# Patient Record
Sex: Female | Born: 1965 | Race: White | Hispanic: Yes | Marital: Married | Smoking: Never smoker
Health system: Southern US, Community
[De-identification: ages and names within clinical notes are randomized; demographics above are authoritative.]

## PROBLEM LIST (undated history)

## (undated) DIAGNOSIS — G4700 Insomnia, unspecified: Secondary | ICD-10-CM

## (undated) DIAGNOSIS — F32A Depression, unspecified: Secondary | ICD-10-CM

## (undated) DIAGNOSIS — F329 Major depressive disorder, single episode, unspecified: Secondary | ICD-10-CM

## (undated) DIAGNOSIS — N200 Calculus of kidney: Secondary | ICD-10-CM

## (undated) HISTORY — DX: Calculus of kidney: N20.0

## (undated) HISTORY — PX: LITHOTRIPSY: SUR834

## (undated) HISTORY — DX: Depression, unspecified: F32.A

## (undated) HISTORY — DX: Insomnia, unspecified: G47.00

## (undated) HISTORY — PX: AUGMENTATION MAMMAPLASTY: SUR837

## (undated) HISTORY — DX: Major depressive disorder, single episode, unspecified: F32.9

---

## 2006-04-09 ENCOUNTER — Other Ambulatory Visit: Admission: RE | Admit: 2006-04-09 | Discharge: 2006-04-09 | Payer: Self-pay | Admitting: Family Medicine

## 2007-10-31 ENCOUNTER — Other Ambulatory Visit: Admission: RE | Admit: 2007-10-31 | Discharge: 2007-10-31 | Payer: Self-pay | Admitting: Family Medicine

## 2008-11-11 ENCOUNTER — Ambulatory Visit (HOSPITAL_COMMUNITY): Admission: RE | Admit: 2008-11-11 | Discharge: 2008-11-11 | Payer: Self-pay | Admitting: Urology

## 2009-01-24 ENCOUNTER — Other Ambulatory Visit: Admission: RE | Admit: 2009-01-24 | Discharge: 2009-01-24 | Payer: Self-pay | Admitting: Family Medicine

## 2009-05-20 ENCOUNTER — Ambulatory Visit (HOSPITAL_BASED_OUTPATIENT_CLINIC_OR_DEPARTMENT_OTHER): Admission: RE | Admit: 2009-05-20 | Discharge: 2009-05-20 | Payer: Self-pay | Admitting: Cardiology

## 2009-05-22 ENCOUNTER — Ambulatory Visit: Payer: Self-pay | Admitting: Internal Medicine

## 2010-06-22 ENCOUNTER — Other Ambulatory Visit: Admission: RE | Admit: 2010-06-22 | Discharge: 2010-06-22 | Payer: Self-pay | Admitting: Family Medicine

## 2011-02-12 LAB — BASIC METABOLIC PANEL
GFR calc Af Amer: >60 mL/min (ref >60)
GFR calc non Af Amer: >60 mL/min (ref >60)
Glucose, Bld: 83 mg/dL (ref 70–99)
Potassium: 3.7 mEq/L (ref 3.5–5.1)
Sodium: 134 mEq/L — ABNORMAL LOW (ref 135–145)

## 2011-02-12 LAB — URINALYSIS, ROUTINE W REFLEX MICROSCOPIC
Bilirubin Urine: NEGATIVE
Nitrite: NEGATIVE
Specific Gravity, Urine: 1.022 (ref 1.005–1.030)
pH: 5.5 (ref 5.0–8.0)

## 2011-02-12 LAB — CBC
HCT: 44.2 % (ref 36.0–46.0)
Hemoglobin: 15 g/dL (ref 12.0–15.0)
RBC: 4.87 MIL/uL (ref 3.87–5.11)
WBC: 6.1 10*3/uL (ref 4.0–10.5)

## 2011-02-12 LAB — PREGNANCY, URINE: Preg Test, Ur: NEGATIVE

## 2011-03-13 NOTE — Procedures (Signed)
Cindy Kelly, Cindy Kelly               ACCOUNT NO.:  [id number]   MEDICAL RECORD NO.:  [id number]          PATIENT TYPE:  OUT   LOCATION:  SLEEP CENTER                 FACILITY:  [facility]   PHYSICIAN:  Clinton D. Maple Hudson, MD, FCCP, FACPDATE OF BIRTH:  08/19/1968   DATE OF STUDY:  05/20/2009                            NOCTURNAL POLYSOMNOGRAM   REFERRING PHYSICIAN:  Armanda Magic, M.D.   INDICATION FOR STUDY:  Insomnia with sleep apnea.  Epworth sleepiness  score 1/24, BMI 22.5, weight 123 pounds, height 62 inches, neck 13.5  inches.  Home medication charted and reviewed.   SLEEP ARCHITECTURE:  Total sleep time 288 minutes with sleep efficiency  73.1%.  Stage I was 4.5%, stage II 95.5%, stages III and REM were  absent.  Sleep latency 2 minutes, awake after sleep onset 50.5 minutes,  arousal index 8.8.  Ambien 10 mg was taken at 8:20 p.m.   RESPIRATORY DATA:  Apnea/hypopnea index (AHI) 0 per hour.  No  respiratory sleep disturbance was noted.   OXYGEN DATA:  No snoring noted by technician.  Oxygen desaturation to a  nadir of 95% on room air.  Mean oxygen saturation through the study was  97.2% on room air.   CARDIAC DATA:  Normal sinus rhythm.   MOVEMENT/PARASOMNIA:  Periodic limb movement.  A total of 75 limb jerks  was counted, of which none were associated with arousal or awakening.  No bathroom trips.   IMPRESSION/RECOMMENDATIONS:  1. After Ambien 10 mg, sleep architecture was primarily remarkable for      absence of stages III and REM.  She reported home bedtime 8:30      p.m., awakening 4 a.m. and at home was more comfortable because of      memory foam.  She emphasized recent marital stress as a sleep      problem in discussion with technician.  She was disturbance by      temperature expansion noise from the window, but did not request      ear plugs.  She woke at 2:00 a.m. and was unable to return to      sleep, requesting the study at 3:00 a.m.  2. No respiratory sleep  disturbance.  AHI 0 per hour.  No snoring.      Oxygen desaturation nadir was 95% with mean oxygen saturation      through the study 97.2% on room air.  3. Occasional limb jerks, but none distinctly associated with arousal      or awakening.     Clinton D. Maple Hudson, MD, [facility], FACP  Diplomate, Biomedical engineer of Sleep Medicine  Electronically Signed    CDY/MEDQ  D:  05/21/2009 15:15:49  T:  05/21/2009 23:02:54  Job:  [id number]

## 2011-04-27 ENCOUNTER — Institutional Professional Consult (permissible substitution): Payer: Self-pay | Admitting: Internal Medicine

## 2011-06-29 ENCOUNTER — Telehealth: Payer: Self-pay | Admitting: Internal Medicine

## 2011-06-29 ENCOUNTER — Encounter: Payer: Self-pay | Admitting: Internal Medicine

## 2011-06-29 ENCOUNTER — Ambulatory Visit (INDEPENDENT_AMBULATORY_CARE_PROVIDER_SITE_OTHER): Payer: Managed Care, Other (non HMO) | Admitting: Internal Medicine

## 2011-06-29 VITALS — BP 116/72 | HR 92 | Ht 62.0 in | Wt 121.6 lb

## 2011-06-29 DIAGNOSIS — F5102 Adjustment insomnia: Secondary | ICD-10-CM

## 2011-06-29 DIAGNOSIS — F419 Anxiety disorder, unspecified: Secondary | ICD-10-CM | POA: Insufficient documentation

## 2011-06-29 DIAGNOSIS — F341 Dysthymic disorder: Secondary | ICD-10-CM

## 2011-06-29 DIAGNOSIS — F32A Depression, unspecified: Secondary | ICD-10-CM | POA: Insufficient documentation

## 2011-06-29 DIAGNOSIS — F329 Major depressive disorder, single episode, unspecified: Secondary | ICD-10-CM

## 2011-06-29 DIAGNOSIS — F5109 Other insomnia not due to a substance or known physiological condition: Secondary | ICD-10-CM | POA: Insufficient documentation

## 2011-06-29 MED ORDER — LORAZEPAM 1 MG PO TABS: ORAL_TABLET | ORAL | Status: AC

## 2011-06-29 NOTE — Assessment & Plan Note (Signed)
These are her primary problem and her sleep disorder is secondary as discussed below.

## 2011-06-29 NOTE — Telephone Encounter (Signed)
Per CY---please have the pt call back on Tuesday to see if the office note has been completed. Called and spoke with pt and she stated that she will call back on Tuesday per CY recs.

## 2011-06-29 NOTE — Progress Notes (Signed)
Subjective:    Patient ID: Cindy Kelly, female    DOB: 06/24/1966, 45 y.o.   MRN: [id number]  HPI 06/29/11- 64 year old woman referred courtesy of Dr. Laurann Montana because of insomnia. She complains that she can't fall asleep on her own. Within the past 6 weeks she states she has slept through 8 hours on only 3 nights. In the past she was a very sound sleeper. She underwent a marital separation in September of 2008 and there have been ongoing custody troubles since then. A hearing is scheduled in October on this issue. Beginning in 2009 she has had a great deal of difficulty initiating and maintaining sleep. She has aimed for a bedtime around 8:30 PM but does not fall asleep without medication. She intends to get up around 5 AM. Sleep medications give only temporary help. She describes efforts with Ambien, Ambien CR, Edular, Lunesta. Remeron caused sweating,shortness of breath and palpitation. She was given Wellbutrin XL to take in the morning and Abilify to take at night but has not given those a significant trial. When she began having side effects with Remeron she was told to stop the other meds and wait until she saw me. Most recently she was given lorazepam 1 mg but she has not yet taken it. She describes getting exhausted during the day. If she sits and closes her eyes she remains very aware of her surroundings and insists that she does not sleep. She describes feeling continuously exhausted with difficulty concentrating and remembering. She recently saw a psychiatrist Dr. 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 She has seen a counselor who offered diagnoses of PTSD and anxiety. Cindy Kelly agrees anxiety is an important part of her problem. There is no prior history of thyroid or other medical disorder, or surgery. She is told she is in early menopause. 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 She is originally [place] the [place]. Children are aged 20, 58, and 78 and they live with her. She does not use alcohol, street drugs or tobacco. A nocturnal polysomnogram done at the [facility] 05/20/2009 recorded an Epworth sleepiness score of 1/24, weight of 123 pounds and seems to show a medication effect, being mostly stage II sleep. There was no sleep disordered breathing with an AHI of 0 per hour, no snoring and normal oxygenation. There was no movement disturbance. She woke at 2 AM, unable to return to sleep and the study was ended at 3 AM.  Review of Systems Constitutional:  5 lbweight loss  No- night sweats, fevers, chills,   + fatigue, lassitude. HEENT:   No-  headaches, difficulty swallowing, tooth/dental problems, sore throat,       No-  sneezing, itching, ear ache, nasal congestion, post nasal drip,  CV:  No-   chest pain, orthopnea, PND, swelling in lower extremities, anasarca, dizziness, palpitations Resp: No-   shortness of breath with exertion or at rest.              No-   productive cough,  No non-productive cough,  No-  coughing up of blood.              No-   change in color of mucus.  No- wheezing.   Skin: No-   rash or lesions. GI:  No-   heartburn, indigestion, abdominal pain, nausea, vomiting, diarrhea,  change in bowel habits, loss of appetite GU: No-   dysuria, change in color of urine, no urgency or frequency.  No- flank pain. MS:  No-   joint pain or swelling.  No- decreased range of motion.  No- back pain. Neuro- HPI  Psych: HPI      Objective:   Physical Exam General- Alert, Oriented, Affect-depressed, Distress+  Tearful, blowing nose   Skin- rash-none, lesions- none, excoriation- none Lymphadenopathy- none Head- atraumatic            Eyes- Gross vision intact, PERRLA, conjunctivae clear secretions            Ears- Hearing, canals normal            Nose- Clear, No-Septal dev, mucus,  polyps, erosion, perforation     +watery rhinorhea            Throat- Mallampati II , mucosa clear , drainage- none, tonsils- atrophic Neck- flexible , trachea midline, no stridor , thyroid nl, carotid no bruit Chest - symmetrical excursion , unlabored           Heart/CV- RRR , no murmur , no gallop  , no rub, nl s1 s2                           - JVD- none , edema- none, stasis changes- none, varices- none           Lung- clear to P&A, wheeze- none, cough- none , dullness-none, rub- none           Chest wall-  Abd- tender-no, distended-no, bowel sounds-present, HSM- no Br/ Gen/ Rectal- Not done, not indicated Extrem- cyanosis- none, clubbing, none, atrophy- none, strength- nl Neuro- grossly intact to observation         Assessment & Plan:

## 2011-06-29 NOTE — Patient Instructions (Addendum)
It will be very helpful for you to continue to build a working relationship with your therapist, assisted by Dr Cliffton Asters and Dr Tomasa Rand.  I would like you to look into cognitive behavioral therapy as an adjunct therapy strategy that has been shown to help with insomnia, in addition to our medical therapies. Two web sites are: Cbtforinsomnia.com SleepIO    (letter i  And letter o)   Suggest you take your lorazepam:   1/2 tab in the morning and 1/2 tab in early afternoon   Then 2 mg about 15-20 minutes before bedtime.    Cc Dr Cliffton Asters, Dr Tomasa Rand

## 2011-06-29 NOTE — Assessment & Plan Note (Addendum)
Strong link between her difficulty initiating and maintaining sleep, and her marital/ custodial struggle. She may eventually need in-patient help. Her sleep disorder is a symptom of the problem but has taken on an importance of its own. She probably will do best with around-the-clock help with the anxiety issue, so it doesn't build to a climax as bedtime approaches. 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   I am introducing cognitive behavioral therapy as a resource adjunct. She is building a raport with her therapist, but will need resolution of the underlying stressor- hopefully the hearing in October will make progress.  I would like to see anxiety and depression managed in coordination between Dr Tomasa Rand, the counselor and Dr Cliffton Asters. I am suggesting she spread the lorazepam she already has, so it works more continuously for her through the day. She does not have an organic sleep disorder by her history or by the 2010 sleep test.

## 2011-07-09 ENCOUNTER — Encounter: Payer: Self-pay | Admitting: Cardiology

## 2011-08-14 ENCOUNTER — Ambulatory Visit: Payer: Managed Care, Other (non HMO) | Admitting: Internal Medicine

## 2011-12-03 ENCOUNTER — Other Ambulatory Visit (HOSPITAL_COMMUNITY)
Admission: RE | Admit: 2011-12-03 | Discharge: 2011-12-03 | Disposition: A | Discharge status: Home / Self Care | Payer: Managed Care, Other (non HMO) | Source: Ambulatory Visit | Attending: Family Medicine | Admitting: Family Medicine

## 2011-12-03 ENCOUNTER — Other Ambulatory Visit: Payer: Self-pay | Admitting: Family Medicine

## 2011-12-03 DIAGNOSIS — Z124 Encounter for screening for malignant neoplasm of cervix: Secondary | ICD-10-CM | POA: Insufficient documentation

## 2012-10-27 ENCOUNTER — Other Ambulatory Visit (HOSPITAL_BASED_OUTPATIENT_CLINIC_OR_DEPARTMENT_OTHER): Payer: Self-pay | Admitting: Family Medicine

## 2012-10-27 DIAGNOSIS — Z1231 Encounter for screening mammogram for malignant neoplasm of breast: Secondary | ICD-10-CM

## 2012-10-28 ENCOUNTER — Ambulatory Visit (HOSPITAL_BASED_OUTPATIENT_CLINIC_OR_DEPARTMENT_OTHER)
Admission: RE | Admit: 2012-10-28 | Discharge: 2012-10-28 | Disposition: A | Discharge status: Home / Self Care | Payer: Managed Care, Other (non HMO) | Source: Ambulatory Visit | Attending: Family Medicine | Admitting: Family Medicine

## 2012-10-28 DIAGNOSIS — Z1231 Encounter for screening mammogram for malignant neoplasm of breast: Secondary | ICD-10-CM | POA: Insufficient documentation

## 2012-11-05 ENCOUNTER — Other Ambulatory Visit: Payer: Self-pay | Admitting: Family Medicine

## 2012-11-05 DIAGNOSIS — R928 Other abnormal and inconclusive findings on diagnostic imaging of breast: Secondary | ICD-10-CM

## 2012-11-24 ENCOUNTER — Ambulatory Visit
Admission: RE | Admit: 2012-11-24 | Discharge: 2012-11-24 | Disposition: A | Discharge status: Home / Self Care | Payer: Managed Care, Other (non HMO) | Source: Ambulatory Visit | Attending: Family Medicine | Admitting: Family Medicine

## 2012-11-24 DIAGNOSIS — R928 Other abnormal and inconclusive findings on diagnostic imaging of breast: Secondary | ICD-10-CM

## 2014-04-16 ENCOUNTER — Other Ambulatory Visit (HOSPITAL_BASED_OUTPATIENT_CLINIC_OR_DEPARTMENT_OTHER): Payer: Self-pay | Admitting: *Deleted

## 2014-04-16 DIAGNOSIS — Z1231 Encounter for screening mammogram for malignant neoplasm of breast: Secondary | ICD-10-CM

## 2014-05-05 ENCOUNTER — Ambulatory Visit (HOSPITAL_BASED_OUTPATIENT_CLINIC_OR_DEPARTMENT_OTHER): Payer: Managed Care, Other (non HMO)

## 2014-05-07 ENCOUNTER — Other Ambulatory Visit (HOSPITAL_BASED_OUTPATIENT_CLINIC_OR_DEPARTMENT_OTHER): Payer: Self-pay | Admitting: Physician Assistant

## 2014-05-07 ENCOUNTER — Ambulatory Visit (HOSPITAL_BASED_OUTPATIENT_CLINIC_OR_DEPARTMENT_OTHER)
Admission: RE | Admit: 2014-05-07 | Discharge: 2014-05-07 | Disposition: A | Discharge status: Home / Self Care | Payer: Managed Care, Other (non HMO) | Source: Ambulatory Visit | Attending: Diagnostic Radiology | Admitting: Diagnostic Radiology

## 2014-05-07 DIAGNOSIS — Z1231 Encounter for screening mammogram for malignant neoplasm of breast: Secondary | ICD-10-CM

## 2014-05-18 ENCOUNTER — Encounter: Payer: Self-pay | Admitting: Family

## 2014-05-18 ENCOUNTER — Ambulatory Visit (INDEPENDENT_AMBULATORY_CARE_PROVIDER_SITE_OTHER): Payer: Managed Care, Other (non HMO) | Admitting: Family

## 2014-05-18 VITALS — BP 100/70 | HR 81 | Temp 97.8°F | Resp 14 | Ht 61.0 in | Wt 126.1 lb

## 2014-05-18 DIAGNOSIS — F32A Depression, unspecified: Secondary | ICD-10-CM

## 2014-05-18 DIAGNOSIS — F329 Major depressive disorder, single episode, unspecified: Secondary | ICD-10-CM

## 2014-05-18 DIAGNOSIS — H40059 Ocular hypertension, unspecified eye: Secondary | ICD-10-CM | POA: Insufficient documentation

## 2014-05-18 DIAGNOSIS — F419 Anxiety disorder, unspecified: Principal | ICD-10-CM

## 2014-05-18 DIAGNOSIS — F341 Dysthymic disorder: Secondary | ICD-10-CM

## 2014-05-18 DIAGNOSIS — G4700 Insomnia, unspecified: Secondary | ICD-10-CM

## 2014-05-18 MED ORDER — SUVOREXANT 10 MG PO TABS: 1.0000 | ORAL_TABLET | Freq: Every day | ORAL | Status: DC

## 2014-05-18 MED ORDER — ESCITALOPRAM OXALATE 10 MG PO TABS: ORAL_TABLET | ORAL | Status: DC

## 2014-05-18 NOTE — Patient Instructions (Signed)
Stop temazepam. Start Belsomra for sleep. Start lexapr 10mg .  1/2 tab by mouth once daily for 1 week, then increase to a full tab on week two. Follow up in 1 month.

## 2014-05-18 NOTE — Progress Notes (Signed)
Pre visit review using our clinic review tool, if applicable. No additional management support is needed unless otherwise documented below in the visit note. 

## 2014-05-18 NOTE — Progress Notes (Signed)
   Subjective:    Patient ID: Cindy Kelly, female    DOB: 10/25/1966, 48 y.o.   MRN: [id number]  HPI  [name] is a 48 yr old female who presents today to establish care.  She has two concerns today.   Depression/anxiety and insomnia- works at [employer].  Reports recent increase in job demands.  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  Reports + nausea when she is at work.  She reports that she feels afraid to eat due to fear of vomittng while at work.  She is a divorced single mom.  Has 2 teenaged daughters.   Their father is behind in childcare payments.   She is maintained on Wellbutrin, since 2013.  Reports 1 month ago her Wellbutrin was increased from 150mg  to 300mg . [facility]ie [facility].   Reports that she lays in bed and can't fall asleep despite feeling so tired.  Wakes up feeling "like a zombie."  Has tried and failed [place], restoril,  [place]unesta.     Review of Systems    see HPI  Past Medical History  Diagnosis Date  . Insomnia   . Depression     History   Social History  . Marital Status: Single    Spouse Name: N/A    Number of Children: 3  . Years of Education: N/A   Occupational History  . 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 Smoking status: Never Smoker   . Smokeless tobacco: Never Used  . Alcohol Use: No  . Drug Use: Not on file  . Sexual Activity: Not on file   Other Topics Concern  . Not on file   Social History Narrative  . No narrative on file    Past Surgical History  Procedure Laterality Date  . Lithotripsy  2007-2015    x 3    Family History  Problem Relation Age of Onset  . Kidney disease Father   . Glaucoma Father     Allergies  Allergen Reactions  . Aripiprazole     Headache, nausea, unable to sleep    No current outpatient prescriptions on file prior to visit.   No current  facility-administered medications on file prior to visit.    BP 100/70  Pulse 81  Temp(Src) 97.8 F (36.6 C) (Oral)  Resp 14  Ht 5\' 1"  (1.549 m)  Wt 126 lb 1.3 oz (57.19 kg)  BMI 23.84 kg/m2  SpO2 99%  LMP 05/04/2014    Objective:   Physical Exam  Constitutional: She appears well-developed and well-nourished. No distress.  Psychiatric: Her behavior is normal. Judgment and thought content normal.  Pleasant but tearful.            Assessment & Plan:  35 minutes spent with pt.  >50% of this time was spent counseling pt on her anxiety and depression.

## 2014-05-19 ENCOUNTER — Telehealth: Payer: Self-pay | Admitting: *Deleted

## 2014-05-19 MED ORDER — ZOLPIDEM TARTRATE 5 MG PO TABS: 5.0000 mg | ORAL_TABLET | Freq: Every evening | ORAL | Status: DC | PRN

## 2014-05-19 NOTE — Telephone Encounter (Signed)
Spoke with pt. She states she has taken this in the past and and developed tolerance to it. Has also taken lunesta in the past. Advised pt that we will be limited in cost effective choices since she states she has tried several sleep aids in the past. Pt voices understanding and states she will try Remus Lofflerambien again since she has not had it in some time but would like to know if there are any other alternatives going forward? Rx called to pharmacy voicemail.  Please advise.

## 2014-05-19 NOTE — Telephone Encounter (Signed)
Can try ambien instead. Pended below.

## 2014-05-19 NOTE — Telephone Encounter (Signed)
Received message from pt that Belsomra is not covered by her insurance and cost would be $315. Pt cannot afford this and would like cheaper alternative.  Please advise.

## 2014-05-19 NOTE — Telephone Encounter (Signed)
Left message for pt to return my call.

## 2014-05-20 NOTE — Telephone Encounter (Signed)
Ambien, Lunesta and belsomra are the 3 major sleep aids.  Lets see how she does with a retrial of the ambien. There are some other meds we could try but they generally don't work any better than ambien.

## 2014-05-20 NOTE — Telephone Encounter (Signed)
Pt was contacted and VM left alerting her of Ambien Rx has been called in for her.

## 2014-05-21 DIAGNOSIS — G4700 Insomnia, unspecified: Secondary | ICD-10-CM | POA: Insufficient documentation

## 2014-05-21 NOTE — Assessment & Plan Note (Signed)
Deteriorated. Will continue wellbutrin and add lexapro. Advised pt to consider meeting with a counselor.

## 2014-05-21 NOTE — Assessment & Plan Note (Signed)
Attempted belsomra but insurance will not cover. Rx sent for Hewlett-Packardambien.

## 2014-05-28 ENCOUNTER — Ambulatory Visit: Payer: Managed Care, Other (non HMO) | Admitting: Family

## 2014-06-28 ENCOUNTER — Ambulatory Visit: Payer: Managed Care, Other (non HMO) | Admitting: Family

## 2014-07-07 ENCOUNTER — Ambulatory Visit: Payer: Managed Care, Other (non HMO) | Admitting: Family

## 2014-07-13 ENCOUNTER — Encounter: Payer: Self-pay | Admitting: Family

## 2014-07-13 ENCOUNTER — Ambulatory Visit (INDEPENDENT_AMBULATORY_CARE_PROVIDER_SITE_OTHER): Payer: Managed Care, Other (non HMO) | Admitting: Family

## 2014-07-13 VITALS — BP 110/80 | HR 80 | Temp 97.8°F | Resp 16 | Ht 61.0 in | Wt 130.0 lb

## 2014-07-13 DIAGNOSIS — F419 Anxiety disorder, unspecified: Secondary | ICD-10-CM

## 2014-07-13 DIAGNOSIS — F5109 Other insomnia not due to a substance or known physiological condition: Secondary | ICD-10-CM

## 2014-07-13 DIAGNOSIS — R5383 Other fatigue: Secondary | ICD-10-CM | POA: Insufficient documentation

## 2014-07-13 DIAGNOSIS — R110 Nausea: Secondary | ICD-10-CM

## 2014-07-13 DIAGNOSIS — F341 Dysthymic disorder: Secondary | ICD-10-CM

## 2014-07-13 DIAGNOSIS — F5102 Adjustment insomnia: Secondary | ICD-10-CM

## 2014-07-13 DIAGNOSIS — R5381 Other malaise: Secondary | ICD-10-CM

## 2014-07-13 DIAGNOSIS — F329 Major depressive disorder, single episode, unspecified: Secondary | ICD-10-CM

## 2014-07-13 DIAGNOSIS — R5382 Chronic fatigue, unspecified: Secondary | ICD-10-CM

## 2014-07-13 DIAGNOSIS — F32A Depression, unspecified: Secondary | ICD-10-CM

## 2014-07-13 MED ORDER — ESCITALOPRAM OXALATE 10 MG PO TABS: ORAL_TABLET | ORAL | Status: DC

## 2014-07-13 MED ORDER — ALPRAZOLAM 0.5 MG PO TABS: 0.5000 mg | ORAL_TABLET | Freq: Every evening | ORAL | Status: DC | PRN

## 2014-07-13 NOTE — Assessment & Plan Note (Signed)
Will obtain LFT. No abdominal pain.  Consider abd US/addition of PPI if symptoms worsen.

## 2014-07-13 NOTE — Patient Instructions (Signed)
Stop ambien, start xanax as needed. Continue lexapro and wellbutrin.  Schedule lab appointment at the front desk. Ask front desk re: scheduling appointment with therapist Berniece Andreas. Follow up in 2 months.

## 2014-07-13 NOTE — Progress Notes (Signed)
Subjective:    Patient ID: [name], female    DOB: [birth year], 48 y.o.   MRN: [id number]  HPI  [name] is a 48 yr old female who presets today for follow up.  Last visit she noted worsening anxiety and depression.  She was continued on wellbutrin and lexapro was added to her regimen.  We attempted to initiate belsomra, but this was not covered by her insurance and instead rx was given for [place].    No significant improvement on lexapro.  Feels "cold".  Feels tired all the time.   Denies abd pain,+ intermittent nausea, denies vomitting.   Review of Systems    see HPI  Past Medical History  Diagnosis Date  . Insomnia   . Depression     History   Social History  . Marital Status: Single    Spouse Name: N/A    Number of Children: 3  . 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 Smoking status: Never Smoker   . Smokeless tobacco: Never Used  . Alcohol Use: No  . Drug Use: Not on file  . Sexual Activity: Not on file   Other Topics Concern  . Not on file   Social History Narrative  . No narrative on file    Past Surgical History  Procedure Laterality Date  . Lithotripsy  2007-2015    x 3    Family History  Problem Relation Age of Onset  . Kidney disease Father   . Glaucoma Father     Allergies  Allergen Reactions  . Aripiprazole     Headache, nausea, unable to sleep    Current Outpatient Prescriptions on File Prior to Visit  Medication Sig Dispense Refill  . buPROPion (WELLBUTRIN XL) 150 MG 24 hr tablet Take 300 mg by mouth daily.      Marland Kitchen escitalopram (LEXAPRO) 10 MG tablet 1/2 tab by mouth once daily for 1 week, then increase to a full tab on week two  30 tablet  0  . SIMBRINZA 1-0.2 % SUSP Place 1 drop into both eyes 3 (three) times daily.      . Suvorexant (BELSOMRA) 10 MG TABS Take 1 tablet by mouth daily.  30 tablet  0  . timolol (TIMOPTIC) 0.5 % ophthalmic  solution Place 1 drop into both eyes every morning.      . zolpidem (AMBIEN) 5 MG tablet Take 1 tablet (5 mg total) by mouth at bedtime as needed for sleep.  30 tablet  0   No current facility-administered medications on file prior to visit.    BP 110/80  Pulse 80  Temp(Src) 97.8 F (36.6 C) (Oral)  Resp 16  Ht  (1.549 m)  Wt 130 lb (58.968 kg)  BMI 24.58 kg/m2  SpO2 94%    Objective:   Physical Exam  Constitutional: She is oriented to person, place, and time. She appears well-developed and well-nourished. No distress.  Cardiovascular: Normal rate and regular rhythm.   No murmur heard. Pulmonary/Chest: Effort normal and breath sounds normal. No respiratory distress. She has no wheezes. She has no rales. She exhibits no tenderness.  Abdominal: Soft. Bowel sounds are normal. She exhibits no distension. There is no tenderness.  Neurological: She is alert and oriented to person, place, and time.  Skin: Skin is warm and dry.  Psychiatric: She has a normal mood and affect. Her behavior  is normal. Judgment and thought content normal.          Assessment & Plan:

## 2014-07-13 NOTE — Progress Notes (Signed)
Pre visit review using our clinic review tool, if applicable. No additional management support is needed unless otherwise documented below in the visit note. 

## 2014-07-13 NOTE — Assessment & Plan Note (Signed)
D/c ambien, trial of xanax HS, prn- controlled substance contract is signed.

## 2014-07-13 NOTE — Assessment & Plan Note (Signed)
She seems calmer today.  She is not tearful.  Continue wellbutrin and lexapro.  She is interested in meeting with a therapist. Advised her to schedule visit with Berniece Andreas

## 2014-07-13 NOTE — Assessment & Plan Note (Signed)
Could be secondary to poor sleeping. Will check CBC to rule out anemia and TSH to evaluate for hypothyroid.

## 2014-07-14 ENCOUNTER — Other Ambulatory Visit: Payer: Managed Care, Other (non HMO)

## 2014-07-14 NOTE — Addendum Note (Signed)
Addended by: Silvio Pate D on: 07/14/2014 01:22 PM   Modules accepted: Orders

## 2014-07-15 LAB — HEPATIC FUNCTION PANEL
ALBUMIN: 4 g/dL (ref 3.5–5.2)
ALT: 18 U/L (ref 0–35)
AST: 22 U/L (ref 0–37)
Alkaline Phosphatase: 71 U/L (ref 39–117)
Bilirubin, Direct: 0 mg/dL (ref 0.0–0.3)
Total Bilirubin: 0.3 mg/dL (ref 0.2–1.2)
Total Protein: 7.9 g/dL (ref 6.0–8.3)

## 2014-07-15 LAB — CBC WITH DIFFERENTIAL/PLATELET
BASOS PCT: 0.2 % (ref 0.0–3.0)
Basophils Absolute: 0 10*3/uL (ref 0.0–0.1)
EOS PCT: 1.7 % (ref 0.0–5.0)
Eosinophils Absolute: 0.1 10*3/uL (ref 0.0–0.7)
HEMATOCRIT: 44.3 % (ref 36.0–46.0)
HEMOGLOBIN: 14.6 g/dL (ref 12.0–15.0)
LYMPHS ABS: 1.9 10*3/uL (ref 0.7–4.0)
Lymphocytes Relative: 22.8 % (ref 12.0–46.0)
MCHC: 33 g/dL (ref 30.0–36.0)
MCV: 91.5 fl (ref 78.0–100.0)
MONOS PCT: 3.6 % (ref 3.0–12.0)
Monocytes Absolute: 0.3 10*3/uL (ref 0.1–1.0)
NEUTROS ABS: 5.8 10*3/uL (ref 1.4–7.7)
Neutrophils Relative %: 71.7 % (ref 43.0–77.0)
PLATELETS: 279 10*3/uL (ref 150.0–400.0)
RBC: 4.84 Mil/uL (ref 3.87–5.11)
RDW: 13.7 % (ref 11.5–15.5)
WBC: 8.1 10*3/uL (ref 4.0–10.5)

## 2014-07-15 LAB — TSH: TSH: 0.77 u[IU]/mL (ref 0.35–4.50)

## 2014-07-16 ENCOUNTER — Encounter: Payer: Self-pay | Admitting: Family

## 2014-07-19 ENCOUNTER — Telehealth: Payer: Self-pay | Admitting: Family

## 2014-07-19 NOTE — Telephone Encounter (Addendum)
Opened in error

## 2014-07-23 ENCOUNTER — Telehealth: Payer: Self-pay | Admitting: Family

## 2014-07-23 NOTE — Telephone Encounter (Signed)
Please call patient with lab results.  She cannot answer her phone at work so leave her a detailed message

## 2014-07-23 NOTE — Telephone Encounter (Signed)
Left detailed message on cell# per 07/16/14 lab letter and to call if any questions.

## 2014-09-15 ENCOUNTER — Ambulatory Visit (INDEPENDENT_AMBULATORY_CARE_PROVIDER_SITE_OTHER): Payer: Managed Care, Other (non HMO) | Admitting: Family

## 2014-09-15 ENCOUNTER — Encounter: Payer: Self-pay | Admitting: Family

## 2014-09-15 ENCOUNTER — Telehealth: Payer: Self-pay | Admitting: *Deleted

## 2014-09-15 VITALS — BP 100/78 | HR 68 | Temp 97.7°F | Resp 16 | Ht 61.0 in | Wt 131.6 lb

## 2014-09-15 DIAGNOSIS — F418 Other specified anxiety disorders: Secondary | ICD-10-CM

## 2014-09-15 DIAGNOSIS — F5109 Other insomnia not due to a substance or known physiological condition: Secondary | ICD-10-CM

## 2014-09-15 DIAGNOSIS — F329 Major depressive disorder, single episode, unspecified: Secondary | ICD-10-CM

## 2014-09-15 DIAGNOSIS — Z0000 Encounter for general adult medical examination without abnormal findings: Secondary | ICD-10-CM

## 2014-09-15 DIAGNOSIS — F32A Depression, unspecified: Secondary | ICD-10-CM

## 2014-09-15 DIAGNOSIS — F419 Anxiety disorder, unspecified: Principal | ICD-10-CM

## 2014-09-15 MED ORDER — ALPRAZOLAM 0.5 MG PO TABS: ORAL_TABLET | ORAL | Status: DC

## 2014-09-15 MED ORDER — BUPROPION HCL ER (XL) 300 MG PO TB24: 300.0000 mg | ORAL_TABLET | Freq: Every day | ORAL | Status: DC

## 2014-09-15 MED ORDER — ESCITALOPRAM OXALATE 20 MG PO TABS: 20.0000 mg | ORAL_TABLET | Freq: Every day | ORAL | Status: DC

## 2014-09-15 NOTE — Assessment & Plan Note (Addendum)
Unchanged.  Worsened by ongoing situation stress with her job. Will increase lexapro from 10mg  to 20mg . Continue wellbutrin xl 300.  I gave her a number for a local therapist who has evening and weekend hours.   I have also given her numbers of some local psychiatrists to see.

## 2014-09-15 NOTE — Telephone Encounter (Signed)
FMLA forms completed and faxed to [employer]. At [phone]. Wellness Form cannot be completed until pt returns to the lab for fasting lipid panel. Order has been placed. Pt needs to schedule lab appt. Left message for pt to return my call.

## 2014-09-15 NOTE — Patient Instructions (Addendum)
Continue wellbutrin 300mg  tab once daily. Increase lexapro from 10mg  to 20mg  once daily. Contact therapist with evening hours to arrange a convenient appointment time. Contact psychiatry for appointment, here are some numbers to try:  Dr. Evelene CroonKaur- [phone] Dr. Alvira MondayMcKinney-([phone]) [phone]

## 2014-09-15 NOTE — Progress Notes (Signed)
Pre visit review using our clinic review tool, if applicable. No additional management support is needed unless otherwise documented below in the visit note. 

## 2014-09-15 NOTE — Assessment & Plan Note (Signed)
Increase xanax from 0.5mg  1 tab PO HS to 1-2 tabs PO HS PRN.

## 2014-09-15 NOTE — Telephone Encounter (Signed)
Pt scheduled lab appointment for 10/20/14. Pt would like to know can she scheduled a tetanus shot that day?

## 2014-09-15 NOTE — Progress Notes (Signed)
Subjective:    Patient ID: Cindy Kelly, female    DOB: [birth year], 48 y.o.   MRN: [id number]  HPI  [name] is a 48 yo female here for follow up of insomnia and anxiety/depression.   1) Insomnia- Sleeps better on weekends. Gets anxious about whether or not she will sleep during the week. She complains of trouble initiating and having interrupted sleep. Wakes up exhausted. Denies daytime somnolence.  Xanax trial did not help. Feels lack of sleep makes the anxiety and depression worse.   2) Has not met with Marya Amsler for therapy. Takes 2 of Wellbutrin XL for 300 mg total + one Lexapro in the morning. Denies panic attacks, feels depression is not getting better. Feels at time it gets worse. She feels sad about stress at work, [facility]. Family lives up [place].   No thoughts of harming self or others today.  Review of Systems See HPI Past Medical History  Diagnosis Date  . Insomnia   . Depression     History   Social History  . Marital Status: Single    Spouse Name: N/A    Number of Children: 3  . 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 Smoking status: Never Smoker   . Smokeless tobacco: Never Used  . Alcohol Use: No  . Drug Use: Not on file  . Sexual Activity: Not on file   Other Topics Concern  . Not on file   Social History Narrative    Past Surgical History  Procedure Laterality Date  . Lithotripsy  2007-2015    x 3    Family History  Problem Relation Age of Onset  . Kidney disease Father   . Glaucoma Father     Allergies  Allergen Reactions  . Aripiprazole     Headache, nausea, unable to sleep    Current Outpatient Prescriptions on File Prior to Visit  Medication Sig Dispense Refill  . ALPRAZolam (XANAX) 0.5 MG tablet Take 1 tablet (0.5 mg total) by mouth at bedtime as needed for anxiety. 30 tablet 0  . buPROPion (WELLBUTRIN XL) 150 MG 24 hr tablet  Take 300 mg by mouth daily.    Marland Kitchen escitalopram (LEXAPRO) 10 MG tablet One tab by mouth once daily 30 tablet 5  . SIMBRINZA 1-0.2 % SUSP Place 1 drop into both eyes 3 (three) times daily.    . timolol (TIMOPTIC) 0.5 % ophthalmic solution Place 1 drop into both eyes every morning.     No current facility-administered medications on file prior to visit.    BP 100/78 mmHg  Pulse 68  Temp(Src) 97.7 F (36.5 C) (Oral)  Resp 16  Ht 5' 1" (1.549 m)  Wt 131 lb 9.6 oz (59.693 kg)  BMI 24.88 kg/m2  SpO2 96%  LMP 08/19/2014       Objective:   Physical Exam  Constitutional: She is oriented to person, place, and time. She appears well-developed and well-nourished.  HENT:  Head: Normocephalic and atraumatic.  Eyes: No scleral icterus.  Musculoskeletal: She exhibits no edema.  Neurological: She is alert and oriented to person, place, and time.  Psychiatric: Her affect is not angry, not blunt, not labile and not inappropriate. Her speech is not rapid and/or pressured, not delayed, not tangential and not slurred. She is slowed. She is not agitated, not aggressive, not hyperactive, not withdrawn, not actively hallucinating and not  combative. Thought content is not paranoid and not delusional. Cognition and memory are not impaired. She does not express impulsivity or inappropriate judgment. She exhibits a depressed mood. She expresses no homicidal and no suicidal ideation. She expresses no suicidal plans and no homicidal plans. She is communicative. She exhibits normal recent memory and normal remote memory.  Tearful today.  She is attentive.         Assessment & Plan:  I have personally seen and examined [name] along with Lorane Gell NP for training/orientation purposes. I agree with [name]' assessment and plan.   She will return for tetanus another day when she is not in a rush.

## 2014-09-17 NOTE — Telephone Encounter (Signed)
Yes, you can schedule her on the nurse schedule if there is a time available.

## 2014-09-21 ENCOUNTER — Other Ambulatory Visit: Payer: Managed Care, Other (non HMO)

## 2014-10-20 ENCOUNTER — Other Ambulatory Visit: Payer: Managed Care, Other (non HMO)

## 2014-11-03 ENCOUNTER — Encounter: Payer: Self-pay | Admitting: *Deleted

## 2015-01-19 ENCOUNTER — Ambulatory Visit (INDEPENDENT_AMBULATORY_CARE_PROVIDER_SITE_OTHER): Payer: BLUE CROSS/BLUE SHIELD | Admitting: Physician Assistant

## 2015-01-19 ENCOUNTER — Other Ambulatory Visit (HOSPITAL_COMMUNITY)
Admission: RE | Admit: 2015-01-19 | Discharge: 2015-01-19 | Disposition: A | Discharge status: Home / Self Care | Payer: BLUE CROSS/BLUE SHIELD | Source: Ambulatory Visit | Attending: Physician Assistant | Admitting: Physician Assistant

## 2015-01-19 ENCOUNTER — Encounter: Payer: Self-pay | Admitting: Physician Assistant

## 2015-01-19 VITALS — BP 108/85 | HR 77 | Temp 98.3°F | Resp 16 | Ht 61.0 in | Wt 128.4 lb

## 2015-01-19 DIAGNOSIS — N760 Acute vaginitis: Secondary | ICD-10-CM | POA: Insufficient documentation

## 2015-01-19 DIAGNOSIS — L710 Perioral dermatitis: Secondary | ICD-10-CM

## 2015-01-19 MED ORDER — FLUCONAZOLE 150 MG PO TABS: 150.0000 mg | ORAL_TABLET | Freq: Every day | ORAL | Status: DC

## 2015-01-19 MED ORDER — MUPIROCIN CALCIUM 2 % EX CREA: 1.0000 "application " | TOPICAL_CREAM | Freq: Two times a day (BID) | CUTANEOUS | Status: DC

## 2015-01-19 NOTE — Patient Instructions (Signed)
For the bumps above your lip, please apply some witch hazel to help dry these lesions up.  Apply the Mupirocin ointment to the area as directed.  For the vaginal itch, please take the Diflucan as directed.  Avoid using perfumed or dyed lotions/soaps on that region. Cetaphil or Dove soaps are a good option. I will call you with your results and we will alter your treatment regimen based on those results.

## 2015-01-19 NOTE — Assessment & Plan Note (Signed)
Unclear.  Some lesions seeming consistent with yeast.  Mild discharge.  Wet prep sent.  Will empirically treat with Diflucan. Supportive measures discussed.

## 2015-01-19 NOTE — Progress Notes (Signed)
Pre visit review using our clinic review tool, if applicable. No additional management support is needed unless otherwise documented below in the visit note/SLS  

## 2015-01-19 NOTE — Assessment & Plan Note (Signed)
Rx mupirocin ointment.  Seabreeze for itch relief and to dry up lesions.

## 2015-01-19 NOTE — Progress Notes (Signed)
   Patient presents to clinic today c/o vaginal itching x 4 days.  Associated symptoms include -- none.  Patient endorses she is not currently sexually active.  Denies rash, discharge, urinary urgency, frequency.  Denies change in soaps or lotions.  Denies history of vaginal yeast infections. Has taken Monistat without relief in symptoms.  Past Medical History  Diagnosis Date  . Insomnia   . Depression     Current Outpatient Prescriptions on File Prior to Visit  Medication Sig Dispense Refill  . ALPRAZolam (XANAX) 0.5 MG tablet 1-2 tabs by mouth at bedtime as needed for sleep 45 tablet 0  . buPROPion (WELLBUTRIN XL) 300 MG 24 hr tablet Take 1 tablet (300 mg total) by mouth daily. 30 tablet 3  . diclofenac (VOLTAREN) 75 MG EC tablet Take 75 mg by mouth daily.  1  . escitalopram (LEXAPRO) 20 MG tablet Take 1 tablet (20 mg total) by mouth daily. 30 tablet 3  . estradiol (ESTRACE) 1 MG tablet Take 1 mg by mouth daily.  6  . SIMBRINZA 1-0.2 % SUSP Place 1 drop into both eyes 3 (three) times daily.    . timolol (TIMOPTIC) 0.5 % ophthalmic solution Place 1 drop into both eyes every morning.     No current facility-administered medications on file prior to visit.    Allergies  Allergen Reactions  . Aripiprazole     Headache, nausea, unable to sleep    Family History  Problem Relation Age of Onset  . Kidney disease Father   . Glaucoma Father     History   Social History  . Marital Status: Single    Spouse Name: N/A  . Number of Children: 3  . Years of Education: N/A   Occupational History  . 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 Smoking status: Never Smoker   . Smokeless tobacco: Never Used  . Alcohol Use: No  . Drug Use: Not on file  . Sexual Activity: Not on file   Other Topics Concern  . None   Social History Narrative   Review of Systems - See HPI.  All other ROS are negative.  BP 108/85 mmHg  Pulse 77  Temp(Src) 98.3 F  (36.8 C) (Oral)  Resp 16  Ht 5\' 1"  (1.549 m)  Wt 128 lb 6 oz (58.231 kg)  BMI 24.27 kg/m2  SpO2 100%  LMP 12/21/2014  Physical Exam  Constitutional: She is well-developed, well-nourished, and in no distress.  HENT:  Head: Normocephalic and atraumatic.  Cardiovascular: Normal rate, regular rhythm, normal heart sounds and intact distal pulses.   Pulmonary/Chest: Effort normal and breath sounds normal.  Genitourinary: Uterus normal, cervix normal, right adnexa normal and left adnexa normal. Vulva exhibits erythema and lesion. Vaginal discharge found.  Neurological: She is alert.  Skin: Skin is warm and dry. No rash noted.  Psychiatric: Affect normal.  Vitals reviewed.  Assessment/Plan: Perioral dermatitis Rx mupirocin ointment.  Seabreeze for itch relief and to dry up lesions.   Vaginitis and vulvovaginitis Unclear.  Some lesions seeming consistent with yeast.  Mild discharge.  Wet prep sent.  Will empirically treat with Diflucan. Supportive measures discussed.

## 2015-01-21 LAB — CERVICOVAGINAL ANCILLARY ONLY
Wet Prep (BD Affirm): NEGATIVE
Wet Prep (BD Affirm): NEGATIVE
Wet Prep (BD Affirm): NEGATIVE

## 2015-01-27 ENCOUNTER — Telehealth: Payer: Self-pay | Admitting: Family

## 2015-01-27 NOTE — Telephone Encounter (Signed)
Caller name: Margy Clarksicolas, Karagan G Relation to pt: self Call back number: [phone]   Reason for call:  Pt was able to view lab results on mychart  In need of clarification. Please advise

## 2015-01-28 NOTE — Telephone Encounter (Signed)
Left message for pt to return my call.

## 2015-02-01 NOTE — Telephone Encounter (Signed)
Left message for pt to return my call. Also, see lab 01/27/15 lab note.

## 2015-05-05 ENCOUNTER — Other Ambulatory Visit: Payer: Self-pay | Admitting: Family

## 2015-05-05 DIAGNOSIS — Z1231 Encounter for screening mammogram for malignant neoplasm of breast: Secondary | ICD-10-CM

## 2015-05-10 ENCOUNTER — Ambulatory Visit (HOSPITAL_BASED_OUTPATIENT_CLINIC_OR_DEPARTMENT_OTHER)
Admission: RE | Admit: 2015-05-10 | Discharge: 2015-05-10 | Disposition: A | Discharge status: Home / Self Care | Payer: BLUE CROSS/BLUE SHIELD | Source: Ambulatory Visit | Attending: Family | Admitting: Family

## 2015-05-10 DIAGNOSIS — Z1231 Encounter for screening mammogram for malignant neoplasm of breast: Secondary | ICD-10-CM

## 2015-12-09 ENCOUNTER — Encounter: Payer: Self-pay | Admitting: Family

## 2015-12-09 ENCOUNTER — Ambulatory Visit (INDEPENDENT_AMBULATORY_CARE_PROVIDER_SITE_OTHER): Payer: BLUE CROSS/BLUE SHIELD | Admitting: Family

## 2015-12-09 ENCOUNTER — Telehealth: Payer: Self-pay | Admitting: Family

## 2015-12-09 VITALS — BP 114/75 | HR 85 | Temp 97.8°F | Resp 16 | Ht 61.0 in | Wt 136.4 lb

## 2015-12-09 DIAGNOSIS — R635 Abnormal weight gain: Secondary | ICD-10-CM | POA: Diagnosis not present

## 2015-12-09 DIAGNOSIS — L700 Acne vulgaris: Secondary | ICD-10-CM

## 2015-12-09 DIAGNOSIS — F32A Depression, unspecified: Secondary | ICD-10-CM

## 2015-12-09 DIAGNOSIS — F419 Anxiety disorder, unspecified: Secondary | ICD-10-CM

## 2015-12-09 DIAGNOSIS — F418 Other specified anxiety disorders: Secondary | ICD-10-CM | POA: Diagnosis not present

## 2015-12-09 DIAGNOSIS — L709 Acne, unspecified: Secondary | ICD-10-CM | POA: Insufficient documentation

## 2015-12-09 DIAGNOSIS — F329 Major depressive disorder, single episode, unspecified: Secondary | ICD-10-CM

## 2015-12-09 DIAGNOSIS — E039 Hypothyroidism, unspecified: Secondary | ICD-10-CM | POA: Diagnosis not present

## 2015-12-09 DIAGNOSIS — G4700 Insomnia, unspecified: Secondary | ICD-10-CM

## 2015-12-09 LAB — TSH: TSH: 1.1 u[IU]/mL (ref 0.35–4.50)

## 2015-12-09 MED ORDER — ADAPALENE-BENZOYL PEROXIDE 0.1-2.5 % EX GEL: 1.0000 "application " | Freq: Every day | CUTANEOUS | Status: DC

## 2015-12-09 MED ORDER — TRAZODONE HCL 50 MG PO TABS: 25.0000 mg | ORAL_TABLET | Freq: Every evening | ORAL | Status: DC | PRN

## 2015-12-09 MED ORDER — BUPROPION HCL ER (XL) 300 MG PO TB24: 300.0000 mg | ORAL_TABLET | Freq: Every day | ORAL | Status: DC

## 2015-12-09 NOTE — Assessment & Plan Note (Signed)
Fair control- see (mychart). Continue wellbutrin, consider adding effexor versus resuming lexapro.  Not clear why she stopped lexapro previously.

## 2015-12-09 NOTE — Progress Notes (Signed)
Subjective:    Patient ID: Cindy Kelly, female    DOB: [birth year], 50 y.o.   MRN: [id number]  HPI  [name] is a 50 yr old female who presents today for follow up.  1) Weight gain- Patient is very concerned about recent weight gain. Describes healthy diet, regular exercise.  Reports she sometimes counts calories and stays around 1300/day.  Wt Readings from Last 3 Encounters:  12/09/15 136 lb 6.4 oz (61.871 kg)  01/19/15 128 lb 6 oz (58.231 kg)  09/15/14 131 lb 9.6 oz (59.693 kg)   2) Depression- reports some dificulty sleeping since her divorce in 2008.  Ambien used to work but then it stopped.  Mood is down some, but has a new job which is less stressful that she is glad about.  Feels down "because of my weight gain."    3) Acne-  Reports recent outbreak of acne.  She has used daughter's epiduo.  This helped. She is requesting rx.   Lab Results  Component Value Date   TSH 0.77 07/14/2014    Review of Systems Past Medical History  Diagnosis Date  . Insomnia   . Depression     Social History   Social History  . Marital Status: Single    Spouse Name: N/A  . Number of Children: 3  . 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 Smoking status: Never Smoker   . Smokeless tobacco: Never Used  . Alcohol Use: No  . Drug Use: Not on file  . Sexual Activity: Not on file   Other Topics Concern  . Not on file   Social History Narrative    Past Surgical History  Procedure Laterality Date  . Lithotripsy  2007-2015    x 3    Family History  Problem Relation Age of Onset  . Kidney disease Father   . Glaucoma Father     Allergies  Allergen Reactions  . Aripiprazole     Headache, nausea, unable to sleep    Current Outpatient Prescriptions on File Prior to Visit  Medication Sig Dispense Refill  . ALPRAZolam (XANAX) 0.5 MG tablet 1-2 tabs by mouth at bedtime as needed for sleep  45 tablet 0  . buPROPion (WELLBUTRIN XL) 300 MG 24 hr tablet Take 1 tablet (300 mg total) by mouth daily. 30 tablet 3  . Multiple Vitamins-Minerals (MULTIVITAMIN GUMMIES ADULT) CHEW Chew 2 each by mouth daily.    Marland Kitchen SIMBRINZA 1-0.2 % SUSP Place 1 drop into both eyes 3 (three) times daily.     No current facility-administered medications on file prior to visit.    BP 114/75 mmHg  Pulse 85  Temp(Src) 97.8 F (36.6 C) (Oral)  Resp 16  Ht  (1.549 m)  Wt 136 lb 6.4 oz (61.871 kg)  BMI 25.79 kg/m2  SpO2 100%  LMP 02/06/2015       Objective:   Physical Exam  Constitutional: She is oriented to person, place, and time. She appears well-developed and well-nourished.  HENT:  Head: Normocephalic and atraumatic.  Musculoskeletal: She exhibits no edema.  Neurological: She is alert and oriented to person, place, and time.  Skin:  + facial acne noted.  Psychiatric: She has a normal mood and affect. Her behavior is normal. Judgment and thought content normal.          Assessment & Plan:  Weight gain- advised  pt if no weight loss on 1300 calories, to drop down to 1100 calories a day. Continue regular cardio.   25 min spent with pt. >50% of this time was spent counseling pt on anxiety/depression/weight loss.

## 2015-12-09 NOTE — Assessment & Plan Note (Signed)
rx sent for epiduo.

## 2015-12-09 NOTE — Assessment & Plan Note (Signed)
Uncontrolled.  Seems to be worse due to concern about weight gain. We did discuss that wellbutrin can have SE of insomnia.  However, I would like to avoid med changes at this time.

## 2015-12-09 NOTE — Telephone Encounter (Signed)
See mychart.  

## 2015-12-09 NOTE — Progress Notes (Signed)
Pre visit review using our clinic review tool, if applicable. No additional management support is needed unless otherwise documented below in the visit note. 

## 2015-12-09 NOTE — Patient Instructions (Addendum)
Start trazodone once daily at bedtime for sleep. Cut your calories from 1300 to 1000/day.  Continue your regular exercise. Start epiduo for your acne.

## 2015-12-13 ENCOUNTER — Telehealth: Payer: Self-pay | Admitting: *Deleted

## 2015-12-13 NOTE — Telephone Encounter (Signed)
Received notice that insurance will require prior auth for Epiduo gel.  Wants to know if pt has tried / failed or had side effects to generic adapalene or any other products for acne. Notified pt. She has never had generic brand and is unsure of other medications she may have tried and failed. She states she was given an ointment last year for her acne. Per review of med list from 12/2014, pt was given mupirocin oint and advised to [place] witch hazel. She states she will email [place] a list of other medications she may have tried in the past. Awaiting response from pt.

## 2015-12-14 ENCOUNTER — Encounter: Payer: Self-pay | Admitting: Family

## 2015-12-14 NOTE — Telephone Encounter (Signed)
Pt responded via mychart and PA was submitted. See 12/14/15 pt email.

## 2015-12-16 NOTE — Telephone Encounter (Signed)
Insurance has denied Epiduo and will fax letter. Will also check with pharmacy to see if they submitted claim for generic as that was one of the questions on the PA form once pharmacy opens. Spoke with pharmacist and she states that there was not an available generic in her system to run claim under.  Please advise?

## 2015-12-19 MED ORDER — ADAPALENE 0.1 % EX GEL: Freq: Every day | CUTANEOUS | Status: DC

## 2015-12-19 NOTE — Telephone Encounter (Signed)
Received letter from insurance and it states that Eipduo is approved when generic adapalene has been tried and failed or not tolerated.  Please advise?

## 2015-12-19 NOTE — Telephone Encounter (Signed)
Received notice via covermymeds that adapalene requires prior auth. PA completed and received approval via covermymeds. Sent message to pt and notified pharmacy. Pharmacist states system still shows adapalene is requiring authorization. I requested that they call pt's insurance for further clarification since covermymeds is saying it is approved. They will call insurance.

## 2015-12-19 NOTE — Telephone Encounter (Signed)
rx sent for generic adapalene.

## 2015-12-23 ENCOUNTER — Telehealth: Payer: Self-pay | Admitting: Family

## 2015-12-23 NOTE — Telephone Encounter (Signed)
Please contact pt and ask her how long she has been off of lexapro and why she discontinued.  Is she interested in restarting lexapro for mood?  If so I can send rx and have her follow up in 1 month.

## 2015-12-26 NOTE — Telephone Encounter (Signed)
Left message for pt to return my call.

## 2015-12-27 NOTE — Telephone Encounter (Signed)
Left detailed message on cell.

## 2016-01-27 NOTE — Telephone Encounter (Signed)
Called left message on cell  to call back

## 2016-04-16 ENCOUNTER — Encounter: Payer: Self-pay | Admitting: Family

## 2016-04-24 DIAGNOSIS — H401131 Primary open-angle glaucoma, bilateral, mild stage: Secondary | ICD-10-CM | POA: Diagnosis not present

## 2016-04-24 DIAGNOSIS — H25813 Combined forms of age-related cataract, bilateral: Secondary | ICD-10-CM | POA: Diagnosis not present

## 2016-07-12 DIAGNOSIS — J069 Acute upper respiratory infection, unspecified: Secondary | ICD-10-CM | POA: Diagnosis not present

## 2016-07-12 DIAGNOSIS — B9789 Other viral agents as the cause of diseases classified elsewhere: Secondary | ICD-10-CM | POA: Diagnosis not present

## 2016-07-16 DIAGNOSIS — J0110 Acute frontal sinusitis, unspecified: Secondary | ICD-10-CM | POA: Diagnosis not present

## 2016-08-08 ENCOUNTER — Other Ambulatory Visit: Payer: Self-pay | Admitting: Family

## 2016-08-08 DIAGNOSIS — Z1231 Encounter for screening mammogram for malignant neoplasm of breast: Secondary | ICD-10-CM

## 2016-08-09 ENCOUNTER — Telehealth: Payer: Self-pay | Admitting: Family

## 2016-08-09 NOTE — Telephone Encounter (Signed)
Sure, that is fine.

## 2016-08-09 NOTE — Telephone Encounter (Signed)
°  Relation to WU:JWJXpt:self Call back [phone] Pharmacy:  Reason for call: pt would like to establish with a primary doctor and wants to est with dr. Patsy Lageropland, states her daughter is getting established with her and she want them to have the same doctor. Ok to switch?

## 2016-08-10 NOTE — Telephone Encounter (Signed)
Ok with me 

## 2016-08-14 ENCOUNTER — Ambulatory Visit (HOSPITAL_BASED_OUTPATIENT_CLINIC_OR_DEPARTMENT_OTHER): Payer: BLUE CROSS/BLUE SHIELD

## 2016-08-14 ENCOUNTER — Other Ambulatory Visit: Payer: Self-pay | Admitting: Family

## 2016-08-14 ENCOUNTER — Ambulatory Visit (HOSPITAL_BASED_OUTPATIENT_CLINIC_OR_DEPARTMENT_OTHER)
Admission: RE | Admit: 2016-08-14 | Discharge: 2016-08-14 | Disposition: A | Discharge status: Home / Self Care | Payer: BLUE CROSS/BLUE SHIELD | Source: Ambulatory Visit | Attending: Family | Admitting: Family

## 2016-08-14 DIAGNOSIS — Z1231 Encounter for screening mammogram for malignant neoplasm of breast: Secondary | ICD-10-CM

## 2016-08-14 NOTE — Telephone Encounter (Signed)
lvm for pt to call and make appt to transfer to Dr. Patsy Lageropland

## 2016-08-14 NOTE — Telephone Encounter (Signed)
Pt called back in. She says that she dont feel that a ov should be needed to establish care with provider. Her concern is that she will have to pay a 30.00 co-pay. Please advise for scheduling.

## 2016-08-16 NOTE — Telephone Encounter (Signed)
Tried to contact pt. Left voicemail informing pt that her copay will be based on her insurance. If she doesn't need to be seen right now she can always wait to establish and come in at a later time. Did inform pt if she has any refills she will need to be seen first since she has not established care with Dr. Patsy Lageropland yet.

## 2016-08-29 ENCOUNTER — Telehealth: Payer: Self-pay | Admitting: Behavioral Health

## 2016-08-29 ENCOUNTER — Ambulatory Visit (INDEPENDENT_AMBULATORY_CARE_PROVIDER_SITE_OTHER): Payer: BLUE CROSS/BLUE SHIELD | Admitting: Family Medicine

## 2016-08-29 ENCOUNTER — Encounter: Payer: Self-pay | Admitting: Family Medicine

## 2016-08-29 VITALS — BP 116/80 | HR 85 | Temp 98.2°F | Ht 61.0 in | Wt 124.6 lb

## 2016-08-29 DIAGNOSIS — R8290 Unspecified abnormal findings in urine: Secondary | ICD-10-CM | POA: Diagnosis not present

## 2016-08-29 LAB — POC URINALSYSI DIPSTICK (AUTOMATED)
Bilirubin, UA: NEGATIVE
Glucose, UA: NEGATIVE
KETONES UA: NEGATIVE
Nitrite, UA: NEGATIVE
PROTEIN UA: NEGATIVE
RBC UA: NEGATIVE
Spec Grav, UA: 1.015
UROBILINOGEN UA: NEGATIVE
pH, UA: 7

## 2016-08-29 MED ORDER — NITROFURANTOIN MONOHYD MACRO 100 MG PO CAPS: 100.0000 mg | ORAL_CAPSULE | Freq: Two times a day (BID) | ORAL | 0 refills | Status: DC

## 2016-08-29 NOTE — Progress Notes (Signed)
Pre visit review using our clinic review tool, if applicable. No additional management support is needed unless otherwise documented below in the visit note. 

## 2016-08-29 NOTE — Patient Instructions (Addendum)
I will be in touch with your urine culture- for the time being we will have you start on macrobid twice a day for 7 days Let me know if you develop any fever, pain, or other concerning symptoms

## 2016-08-29 NOTE — Progress Notes (Signed)
[facility] [address], [address] [place], [place] [phone] [phone]  Date:  08/29/2016   Name:  Cindy Kelly   DOB:  03/21/1966   MRN:  [id number]  PCP:  Lemont Fillers'SULLIVAN,MELISSA S., NP    Chief Complaint: Urinary odor (c/o urinary odor x 3-4 weeks. pt denies any increased urinary frequency, back pain, buring or discomfort. declined flu vaccine. )   History of Present Illness:  Cindy Kelly is a 50 y.o. very pleasant female patient who presents with the following:  She has noted a urinary odor for nearly one month- no hematuria, no dysuria. No vaginal sx or discharge. No belly pain, back pain or fever. The urinary odor has seemed to resolve the last couple of days.   She has an IUD in place She is otherwise feeling well and is generally in good health  Patient Active Problem List   Diagnosis Date Noted  . Acne 12/09/2015  . Perioral dermatitis 01/19/2015  . Vaginitis and vulvovaginitis 01/19/2015  . Nausea alone 07/13/2014  . Fatigue 07/13/2014  . Insomnia 05/21/2014  . Elevated IOP 05/18/2014  . Situational insomnia 06/29/2011  . Anxiety and depression 06/29/2011    Past Medical History:  Diagnosis Date  . Depression   . Insomnia     Past Surgical History:  Procedure Laterality Date  . LITHOTRIPSY  2007-2015   x 3    Social History  Substance Use Topics  . Smoking status: Never Smoker  . Smokeless tobacco: Never Used  . Alcohol use No    Family History  Problem Relation Age of Onset  . Kidney disease Father   . Glaucoma Father     Allergies  Allergen Reactions  . Aripiprazole     Headache, nausea, unable to sleep    Medication list has been reviewed and updated.  Current Outpatient Prescriptions on File Prior to Visit  Medication Sig Dispense Refill  . adapalene (DIFFERIN) 0.1 % gel Apply topically at bedtime. 45 g 0  . Adapalene-Benzoyl Peroxide 0.1-2.5 % gel Apply 1 application  topically at bedtime. 45 g 0  . ALPRAZolam (XANAX) 0.5 MG tablet 1-2 tabs by mouth at bedtime as needed for sleep 45 tablet 0  . buPROPion (WELLBUTRIN XL) 300 MG 24 hr tablet Take 1 tablet (300 mg total) by mouth daily. 30 tablet 3  . Multiple Vitamins-Minerals (MULTIVITAMIN GUMMIES ADULT) CHEW Chew 2 each by mouth daily.    Marland Kitchen. SIMBRINZA 1-0.2 % SUSP Place 1 drop into both eyes 3 (three) times daily.    . traZODone (DESYREL) 50 MG tablet Take 0.5-1 tablets (25-50 mg total) by mouth at bedtime as needed for sleep. 30 tablet 3   No current facility-administered medications on file prior to visit.     Review of Systems:  As per HPI- otherwise negative.   Physical Examination: Vitals:   08/29/16 1547  BP: 116/80  Pulse: 85  Temp: 98.2 F (36.8 C)   Vitals:   08/29/16 1547  Weight: 124 lb 9.6 oz (56.5 kg)  Height: 5\' 1"  (1.549 m)   Body mass index is 23.54 kg/m. Ideal Body Weight: Weight in (lb) to have BMI = 25: 132  GEN: WDWN, NAD, Non-toxic, A & O x 3, normal weight, looks well HEENT: Atraumatic, Normocephalic. Neck supple. No masses, No LAD. Ears and Nose: No external deformity. CV: RRR, No M/G/R. No JVD. No thrill. No extra heart sounds. PULM: CTA B,  no wheezes, crackles, rhonchi. No retractions. No resp. distress. No accessory muscle use. ABD: S, NT, ND, +BS. No rebound. No HSM.  Belly is benign EXTR: No c/c/e NEURO Normal gait.  PSYCH: Normally interactive. Conversant. Not depressed or anxious appearing.  Calm demeanor.  No CVA tenderness  Assessment and Plan: Abnormal urine odor - Plan: POCT Urinalysis Dipstick (Automated), Urine Culture, nitrofurantoin, macrocrystal-monohydrate, (MACROBID) 100 MG capsule  Here today with complaint of urine odor for nearly one month.  Her UA does suggest a UTI.  She would prefer to treat while we await her culture- start on macrobid BID for one week   Signed Abbe AmsterdamJessica Copland, MD

## 2016-08-29 NOTE — Telephone Encounter (Signed)
Patient declined to update Pre-Visit Info at this time.

## 2016-08-30 DIAGNOSIS — R8290 Unspecified abnormal findings in urine: Secondary | ICD-10-CM | POA: Diagnosis not present

## 2016-09-01 LAB — URINE CULTURE

## 2016-10-29 ENCOUNTER — Encounter: Payer: Self-pay | Admitting: Family Medicine

## 2016-10-30 DIAGNOSIS — H401131 Primary open-angle glaucoma, bilateral, mild stage: Secondary | ICD-10-CM | POA: Diagnosis not present

## 2016-10-30 DIAGNOSIS — H25813 Combined forms of age-related cataract, bilateral: Secondary | ICD-10-CM | POA: Diagnosis not present

## 2016-10-30 MED ORDER — MUPIROCIN CALCIUM 2 % EX CREA: 1.0000 "application " | TOPICAL_CREAM | Freq: Two times a day (BID) | CUTANEOUS | 1 refills | Status: DC

## 2016-11-22 ENCOUNTER — Encounter: Payer: Self-pay | Admitting: Family Medicine

## 2016-11-23 MED ORDER — NITROFURANTOIN MONOHYD MACRO 100 MG PO CAPS: 100.0000 mg | ORAL_CAPSULE | Freq: Two times a day (BID) | ORAL | 0 refills | Status: DC

## 2016-12-16 ENCOUNTER — Emergency Department
Admission: EM | Admit: 2016-12-16 | Discharge: 2016-12-16 | Disposition: A | Discharge status: Home / Self Care | Payer: BLUE CROSS/BLUE SHIELD | Attending: Emergency Medicine | Admitting: Emergency Medicine

## 2016-12-16 ENCOUNTER — Emergency Department: Payer: BLUE CROSS/BLUE SHIELD

## 2016-12-16 ENCOUNTER — Encounter: Payer: Self-pay | Admitting: Emergency Medicine

## 2016-12-16 DIAGNOSIS — Z79899 Other long term (current) drug therapy: Secondary | ICD-10-CM | POA: Insufficient documentation

## 2016-12-16 DIAGNOSIS — R109 Unspecified abdominal pain: Secondary | ICD-10-CM | POA: Diagnosis present

## 2016-12-16 DIAGNOSIS — N132 Hydronephrosis with renal and ureteral calculous obstruction: Secondary | ICD-10-CM | POA: Diagnosis not present

## 2016-12-16 DIAGNOSIS — N200 Calculus of kidney: Secondary | ICD-10-CM | POA: Diagnosis not present

## 2016-12-16 LAB — POCT PREGNANCY, URINE: Preg Test, Ur: NEGATIVE

## 2016-12-16 LAB — BASIC METABOLIC PANEL
ANION GAP: 11 (ref 5–15)
BUN: 29 mg/dL — ABNORMAL HIGH (ref 6–20)
CHLORIDE: 103 mmol/L (ref 101–111)
CO2: 26 mmol/L (ref 22–32)
Calcium: 9.3 mg/dL (ref 8.9–10.3)
Creatinine, Ser: 1.07 mg/dL — ABNORMAL HIGH (ref 0.44–1.00)
GFR calc non Af Amer: >60 mL/min (ref >60)
Glucose, Bld: 138 mg/dL — ABNORMAL HIGH (ref 65–99)
POTASSIUM: 3.7 mmol/L (ref 3.5–5.1)
SODIUM: 140 mmol/L (ref 135–145)

## 2016-12-16 LAB — CBC
HEMATOCRIT: 43 % (ref 35.0–47.0)
HEMOGLOBIN: 14.7 g/dL (ref 12.0–16.0)
MCH: 29.8 pg (ref 26.0–34.0)
MCHC: 34.2 g/dL (ref 32.0–36.0)
MCV: 87.1 fL (ref 80.0–100.0)
Platelets: 286 10*3/uL (ref 150–440)
RBC: 4.94 MIL/uL (ref 3.80–5.20)
RDW: 13.6 % (ref 11.5–14.5)
WBC: 13.2 10*3/uL — ABNORMAL HIGH (ref 3.6–11.0)

## 2016-12-16 LAB — HEPATIC FUNCTION PANEL
ALBUMIN: 4.3 g/dL (ref 3.5–5.0)
ALT: 25 U/L (ref 14–54)
AST: 32 U/L (ref 15–41)
Alkaline Phosphatase: 97 U/L (ref 38–126)
BILIRUBIN INDIRECT: 0.6 mg/dL (ref 0.3–0.9)
Bilirubin, Direct: 0.2 mg/dL (ref 0.1–0.5)
TOTAL PROTEIN: 7.7 g/dL (ref 6.5–8.1)
Total Bilirubin: 0.8 mg/dL (ref 0.3–1.2)

## 2016-12-16 LAB — URINALYSIS, ROUTINE W REFLEX MICROSCOPIC
BACTERIA UA: NONE SEEN
Bilirubin Urine: NEGATIVE
Glucose, UA: NEGATIVE mg/dL
HGB URINE DIPSTICK: NEGATIVE
Ketones, ur: 20 mg/dL — AB
Leukocytes, UA: NEGATIVE
Nitrite: NEGATIVE
PROTEIN: 30 mg/dL — AB
SPECIFIC GRAVITY, URINE: 1.021 (ref 1.005–1.030)
pH: 9 — ABNORMAL HIGH (ref 5.0–8.0)

## 2016-12-16 LAB — LIPASE, BLOOD: LIPASE: 36 U/L (ref 11–51)

## 2016-12-16 MED ORDER — OXYCODONE HCL 5 MG PO TABS
5.0000 mg | ORAL_TABLET | Freq: Once | ORAL | Status: AC
  Administered 2016-12-16: 5 mg via ORAL
  Filled 2016-12-16: qty 1

## 2016-12-16 MED ORDER — ONDANSETRON HCL 4 MG/2ML IJ SOLN
INTRAMUSCULAR | Status: AC
  Administered 2016-12-16: 4 mg via INTRAVENOUS
  Filled 2016-12-16: qty 2

## 2016-12-16 MED ORDER — MORPHINE SULFATE (PF) 4 MG/ML IV SOLN
INTRAVENOUS | Status: AC
  Administered 2016-12-16: 4 mg via INTRAVENOUS
  Filled 2016-12-16: qty 1

## 2016-12-16 MED ORDER — KETOROLAC TROMETHAMINE 30 MG/ML IJ SOLN
15.0000 mg | Freq: Once | INTRAMUSCULAR | Status: AC
  Administered 2016-12-16: 15 mg via INTRAVENOUS
  Filled 2016-12-16: qty 1

## 2016-12-16 MED ORDER — ONDANSETRON 4 MG PO TBDP: ORAL_TABLET | ORAL | 0 refills | Status: DC

## 2016-12-16 MED ORDER — SODIUM CHLORIDE 0.9 % IV BOLUS (SEPSIS)
1000.0000 mL | INTRAVENOUS | Status: AC
  Administered 2016-12-16: 1000 mL via INTRAVENOUS

## 2016-12-16 MED ORDER — OXYCODONE-ACETAMINOPHEN 5-325 MG PO TABS: 1.0000 | ORAL_TABLET | ORAL | 0 refills | Status: DC | PRN

## 2016-12-16 MED ORDER — ACETAMINOPHEN 500 MG PO TABS
1000.0000 mg | ORAL_TABLET | Freq: Once | ORAL | Status: AC
  Administered 2016-12-16: 1000 mg via ORAL
  Filled 2016-12-16: qty 2

## 2016-12-16 MED ORDER — ONDANSETRON HCL 4 MG/2ML IJ SOLN
4.0000 mg | Freq: Once | INTRAMUSCULAR | Status: AC
  Administered 2016-12-16: 4 mg via INTRAVENOUS

## 2016-12-16 MED ORDER — DOCUSATE SODIUM 100 MG PO CAPS: ORAL_CAPSULE | ORAL | 0 refills | Status: DC

## 2016-12-16 MED ORDER — MORPHINE SULFATE (PF) 4 MG/ML IV SOLN
4.0000 mg | Freq: Once | INTRAVENOUS | Status: AC
  Administered 2016-12-16: 4 mg via INTRAVENOUS

## 2016-12-16 MED ORDER — TAMSULOSIN HCL 0.4 MG PO CAPS: ORAL_CAPSULE | ORAL | 0 refills | Status: DC

## 2016-12-16 MED ORDER — HYDROMORPHONE HCL 1 MG/ML IJ SOLN
1.0000 mg | INTRAMUSCULAR | Status: AC
  Administered 2016-12-16: 1 mg via INTRAVENOUS
  Filled 2016-12-16: qty 1

## 2016-12-16 NOTE — Discharge Instructions (Signed)
You have been seen in the Emergency Department (ED) today for pain that we believe based on your workup, is caused by kidney stones.  As we have discussed, please drink plenty of fluids.  Please make a follow up appointment with the physician(s) listed elsewhere in this documentation. ° °You may take pain medication as needed but ONLY as prescribed.  Please also take your prescribed Flomax daily.  We also recommend that you take over-the-counter ibuprofen regularly according to label instructions over the next 5 days.  Take it with meals to minimize stomach discomfort. ° °Please see your doctor as soon as possible as stones may take 1-3 weeks to pass and you may require additional care or medications. ° °Do not drink alcohol, drive or participate in any other potentially dangerous activities while taking opiate pain medication as it may make you sleepy. Do not take this medication with any other sedating medications, either prescription or over-the-counter. If you were prescribed Percocet or Vicodin, do not take these with acetaminophen (Tylenol) as it is already contained within these medications. °  °Take Percocet as needed for severe pain.  This medication is an opiate (or narcotic) pain medication and can be habit forming.  Use it as little as possible to achieve adequate pain control.  Do not use or use it with extreme caution if you have a history of opiate abuse or dependence.  If you are on a pain contract with your primary care doctor or a pain specialist, be sure to let them know you were prescribed this medication today from the [facility].  This medication is intended for your use only - do not give any to anyone else and keep it in a secure place where nobody else, especially children, have access to it.  It will also cause or worsen constipation, so you may want to consider taking an over-the-counter stool softener while you are taking this medication. ° °Return to the  Emergency Department (ED) or call your doctor if you have any worsening pain, fever, painful urination, are unable to urinate, or develop other symptoms that concern you. ° °

## 2016-12-16 NOTE — ED Notes (Signed)
Resumed care from Cindy Kelly, [place]. Pt resting comfortably with family at bedside.

## 2016-12-16 NOTE — ED Provider Notes (Signed)
-----------------------------------------   8:18 AM on 12/16/2016 -----------------------------------------   Blood pressure (!) 123/59, pulse (!) 55, temperature 97.5 F (36.4 C), temperature source Oral, resp. rate 14, height 5\' 2"  (1.575 m), weight 120 lb (54.4 kg), SpO2 100 %.  Assuming care from Dr. York CeriseForbach of Margy ClarksKarla G Kelly is a 51 y.o. female with a chief complaint of Flank Pain .    5F diagnosed here with a 4mm obstructive ureteral stone associated with mild to moderate hydronephrosis. No overlying UTI. I was asked by Dr. York CeriseForbach to reassess patient for pain control and PO tolerance before discharge.  _________________________ 10:49 AM on 12/16/2016 -----------------------------------------  Patient's pain has been well-controlled with by mouth medications, she is tolerating by mouth. We'll discharge home with prescriptions and instructions left by Dr. Precious HawsForbach   [place] Michelyn Scullin, MD 12/16/16 1049

## 2016-12-16 NOTE — ED Notes (Signed)
Pt assisted out of car to wheelchair; c/o right mid back/flank pain; history of kidney stones x 3 and says this feels the same

## 2016-12-16 NOTE — ED Notes (Signed)
Prior nurse placed [place] with 2L O2 due to decreasing O2 saturation level after administration of dilaudid. Upon arrival to this room, O2 sat 100%. RA trial resulted in no change to pt's O2 saturation. 100% after 3 minutes

## 2016-12-16 NOTE — ED Notes (Signed)
Pt assisted to toilet 

## 2016-12-16 NOTE — ED Provider Notes (Signed)
[facility] Emergency Department Provider Note  ____________________________________________   First MD Initiated Contact with Patient 12/16/16 0600     (approximate)  I have reviewed the triage vital signs and the nursing notes.   HISTORY  Chief Complaint Flank Pain    HPI Cindy Kelly is a 51 y.o. female with a history of at least 3 prior episodes of kidney stones, at least one of which required laser lithotripsy, who presents with acute onset severe sharp stabbing right sided flank pain.  It occurred in the middle of night and woke her from sleep.  It feels similar to prior episodes.  She has had nausea and vomiting as well.  She denies fever/chills, chest pain, shortness of breath, diarrhea, dysuria, gross hematuria.  Nothing makes the pain better and it was worse with movement.She says there is no chance she could be pregnant.   Past Medical History:  Diagnosis Date  . Depression   . Insomnia     Patient Active Problem List   Diagnosis Date Noted  . Acne 12/09/2015  . Perioral dermatitis 01/19/2015  . Vaginitis and vulvovaginitis 01/19/2015  . Nausea alone 07/13/2014  . Fatigue 07/13/2014  . Insomnia 05/21/2014  . Elevated IOP 05/18/2014  . Situational insomnia 06/29/2011  . Anxiety and depression 06/29/2011    Past Surgical History:  Procedure Laterality Date  . LITHOTRIPSY  2007-2015   x 3    Prior to Admission medications   Medication Sig Start Date End Date Taking? Authorizing Provider  adapalene (DIFFERIN) 0.1 % gel Apply topically at bedtime. 12/19/15   Sandford Craze, NP  Adapalene-Benzoyl Peroxide 0.1-2.5 % gel Apply 1 application topically at bedtime. 12/09/15   Sandford Craze, NP  ALPRAZolam Prudy Feeler) 0.5 MG tablet 1-2 tabs by mouth at bedtime as needed for sleep 09/15/14   Sandford Craze, NP  buPROPion (WELLBUTRIN XL) 300 MG 24 hr tablet Take 1 tablet (300 mg total) by mouth daily. 12/09/15   Sandford Craze,  NP  docusate sodium (COLACE) 100 MG capsule Take 1 tablet once or twice daily as needed for constipation while taking narcotic pain medicine 12/16/16   Loleta Rose, MD  Multiple Vitamins-Minerals (MULTIVITAMIN GUMMIES ADULT) CHEW Chew 2 each by mouth daily.    Historical Provider, MD  mupirocin cream (BACTROBAN) 2 % Apply 1 application topically 2 (two) times daily. Use as needed for facial break-out 10/30/16   Pearline Cables, MD  nitrofurantoin, macrocrystal-monohydrate, (MACROBID) 100 MG capsule Take 1 capsule (100 mg total) by mouth 2 (two) times daily. 11/23/16   Pearline Cables, MD  ondansetron (ZOFRAN ODT) 4 MG disintegrating tablet Allow 1-2 tablets to dissolve in your mouth every 8 hours as needed for nausea/vomiting 12/16/16   Loleta Rose, MD  oxyCODONE-acetaminophen (ROXICET) 5-325 MG tablet Take 1-2 tablets by mouth every 4 (four) hours as needed for severe pain. 12/16/16   Loleta Rose, MD  SIMBRINZA 1-0.2 % SUSP Place 1 drop into both eyes 3 (three) times daily. 03/16/14   Historical Provider, MD  tamsulosin (FLOMAX) 0.4 MG CAPS capsule Take 1 tablet by mouth daily until you pass the kidney stone or no longer have symptoms 12/16/16   Loleta Rose, MD  traZODone (DESYREL) 50 MG tablet Take 0.5-1 tablets (25-50 mg total) by mouth at bedtime as needed for sleep. 12/09/15   Sandford Craze, NP    Allergies Aripiprazole  Family History  Problem Relation Age of Onset  . Kidney disease Father   . Glaucoma Father  Social History Social History  Substance Use Topics  . Smoking status: Never Smoker  . Smokeless tobacco: Never Used  . Alcohol use No    Review of Systems Constitutional: No fever/chills Eyes: No visual changes. ENT: No sore throat. Cardiovascular: Denies chest pain. Respiratory: Denies shortness of breath. Gastrointestinal: Severe right-sided flank pain radiating to the right lower abdomen with nausea and vomiting Genitourinary: Negative for  dysuria. Musculoskeletal: Negative for back pain. Skin: Negative for rash. Neurological: Negative for headaches, focal weakness or numbness.  10-point ROS otherwise negative.  ____________________________________________   PHYSICAL EXAM:  VITAL SIGNS: ED Triage Vitals  Enc Vitals Group     BP 12/16/16 0548 (!) 118/94     Pulse --      Resp 12/16/16 0548 20     Temp 12/16/16 0548 97.5 F (36.4 C)     Temp Source 12/16/16 0548 Oral     SpO2 12/16/16 0548 100 %     Weight 12/16/16 0545 120 lb (54.4 kg)     Height 12/16/16 0545 5\' 2"  (1.575 m)     Head Circumference --      Peak Flow --      Pain Score 12/16/16 0546 10     Pain Loc --      Pain Edu? --      Excl. in GC? --     Constitutional: Alert and oriented. Severe distress from pain Eyes: Conjunctivae are normal. PERRL. EOMI. Head: Atraumatic. Nose: No congestion/rhinnorhea. Mouth/Throat: Mucous membranes are moist. Neck: No stridor.  No meningeal signs.   Cardiovascular: Normal rate, regular rhythm. Good peripheral circulation. Grossly normal heart sounds. Respiratory: Normal respiratory effort.  No retractions. Lungs CTAB. Gastrointestinal: Soft with right sided flank (CVA) and lower abd tenderness, severe Musculoskeletal: No lower extremity tenderness nor edema. No gross deformities of extremities. Neurologic:  Normal speech and language. No gross focal neurologic deficits are appreciated.  Skin:  Skin is warm, dry and intact. No rash noted.   ____________________________________________   LABS (all labs ordered are listed, but only abnormal results are displayed)  Labs Reviewed  BASIC METABOLIC PANEL - Abnormal; Notable for the following:       Result Value   Glucose, Bld 138 (*)    BUN 29 (*)    Creatinine, Ser 1.07 (*)    All other components within normal limits  CBC - Abnormal; Notable for the following:    WBC 13.2 (*)    All other components within normal limits  URINALYSIS, ROUTINE W REFLEX  MICROSCOPIC - Abnormal; Notable for the following:    Color, Urine YELLOW (*)    APPearance CLEAR (*)    pH 9.0 (*)    Ketones, ur 20 (*)    Protein, ur 30 (*)    Squamous Epithelial / LPF 0-5 (*)    All other components within normal limits  HEPATIC FUNCTION PANEL  LIPASE, BLOOD  POCT PREGNANCY, URINE   ____________________________________________  EKG  None - EKG not ordered by ED physician ____________________________________________  RADIOLOGY   Ct Renal Stone Study  Result Date: 12/16/2016 CLINICAL DATA:  Initial evaluation for acute onset severe right flank pain. EXAM: CT ABDOMEN AND PELVIS WITHOUT CONTRAST TECHNIQUE: Multidetector CT imaging of the abdomen and pelvis was performed following the standard protocol without IV contrast. COMPARISON:  Prior radiograph from 01/25/2009. FINDINGS: Lower chest: Visualized lung bases are clear. Bilateral breast implants partially visualized. Hepatobiliary: Liver within normal limits. Gallbladder normal. No biliary dilatation. Pancreas: Pancreas within  normal limits. Spleen: Spleen within normal limits. Adrenals/Urinary Tract: Adrenal glands are normal. Left kidney unremarkable without evidence for nephrolithiasis or hydronephrosis. No radiopaque calculi seen along the course of the left renal collecting system. No left-sided hydroureter. On the right, there is an obstructive 4 mm stone present at the right UVJ with secondary mild to moderate right hydroureteronephrosis. Possible additional punctate 2 mm stone within the distal right ureter (series 2, image 62). No other right-sided ureteral calculi. Additional 3 mm nonobstructive stone present within the lower pole the right kidney. Dystrophic calcifications within the interpolar right kidney, favored to be parenchymal, possibly related underlying cystic lesion. Additional 13 mm cystic lesion with mural calcification within the posterior right kidney noted. Bladder largely decompressed without  acute abnormality. Stomach/Bowel: Stomach within normal limits. No evidence for bowel obstruction. No acute inflammatory changes seen about the bowels. Vascular/Lymphatic: Intra-abdominal and of normal caliber. No pathologically enlarged intra-abdominal or pelvic lymph nodes. Reproductive: IUD in place within the uterus. Uterus and ovaries otherwise unremarkable. Other: No free air or fluid. Musculoskeletal: No acute osseous abnormality. The no worrisome lytic or blastic osseous lesions. IMPRESSION: 1. 4 mm obstructive stone at the right UVJ with secondary mild to moderate right hydroureteronephrosis. 2. Probable additional punctate 2 mm stone within the distal right ureter. 3. Additional nonobstructive right renal nephrolithiasis as above. 4. Right renal cysts with associated calcifications, not well evaluated on this noncontrast examination. 5. No other acute intra-abdominal or pelvic process. Electronically Signed   By: Rise Mu M.D.   On: 12/16/2016 06:56    ____________________________________________   PROCEDURES  Procedure(s) performed:   Procedures   Critical Care performed: No ____________________________________________   INITIAL IMPRESSION / ASSESSMENT AND PLAN / ED COURSE  Pertinent labs & imaging results that were available during my care of the patient were reviewed by me and considered in my medical decision making (see chart for details).  Anticipate ureterolithiasis; patient cannot stay still and appears consistent with renal colic.  Giving morphine and zofran and will scan particularly given that she required lithotripsy in the past.   Clinical Course as of Dec 16 730  Sun Dec 16, 2016  9604 Awaiting CT scan results.  The patient's pain was not helped with the morphine so I am giving Toradol 15 mg IV and Dilaudid 1 mg IV.  [CF]  0659 +48mm UVJ stone CT Renal Stone Study [CF]  0728 Patient's pain is well-controlled at this time.  I updated her with results of  CT scan.  She agrees with my current plan for discharge and outpatient follow-up she is heavily intoxicated with the Dilaudid at this time and needs some time of observation in the emergency department to make sure her pain does not come back as severe as it was previously.  I explained to her that the plan is for the pain to be tolerable, not necessarily absent, and she understands and agrees.  I gave my usual and customary return precautions.  There Is no evidence of infection in her urine and no dictation for antibiotics.  She has a few ketones in her urine so I will give her a liter of fluids while she waits.  I have told Dr. Don Perking about the patient and she will discharged when appropriate.  [CF]  0731 I reviewed the patient's prescription history over the last 12 months in the [place] Controlled Substances Database, and she has filled no controlled prescriptions during that time.  [CF]    Clinical Course User  Index [CF] Loleta Roseory Gaetana Kawahara, MD    ____________________________________________  FINAL CLINICAL IMPRESSION(S) / ED DIAGNOSES  Final diagnoses:  Kidney stone     MEDICATIONS GIVEN DURING THIS VISIT:  Medications  sodium chloride 0.9 % bolus 1,000 mL (not administered)  morphine 4 MG/ML injection 4 mg (4 mg Intravenous Given 12/16/16 0603)  ondansetron (ZOFRAN) injection 4 mg (4 mg Intravenous Given 12/16/16 0602)  ketorolac (TORADOL) 30 MG/ML injection 15 mg (15 mg Intravenous Given 12/16/16 [id number])  HYDROmorphone (DILAUDID) injection 1 mg (1 mg Intravenous Given 12/16/16 0652)     NEW OUTPATIENT MEDICATIONS STARTED DURING THIS VISIT:  New Prescriptions   DOCUSATE SODIUM (COLACE) 100 MG CAPSULE    Take 1 tablet once or twice daily as needed for constipation while taking narcotic pain medicine   ONDANSETRON (ZOFRAN ODT) 4 MG DISINTEGRATING TABLET    Allow 1-2 tablets to dissolve in your mouth every 8 hours as needed for nausea/vomiting   OXYCODONE-ACETAMINOPHEN (ROXICET) 5-325 MG TABLET     Take 1-2 tablets by mouth every 4 (four) hours as needed for severe pain.   TAMSULOSIN (FLOMAX) 0.4 MG CAPS CAPSULE    Take 1 tablet by mouth daily until you pass the kidney stone or no longer have symptoms    Modified Medications   No medications on file    Discontinued Medications   No medications on file     Note:  This document was prepared using Dragon voice recognition software and may include unintentional dictation errors.    Loleta Roseory Yasseen Salls, MD 12/16/16 [phone]

## 2017-01-25 DIAGNOSIS — Z0000 Encounter for general adult medical examination without abnormal findings: Secondary | ICD-10-CM | POA: Diagnosis not present

## 2017-02-20 DIAGNOSIS — R748 Abnormal levels of other serum enzymes: Secondary | ICD-10-CM | POA: Diagnosis not present

## 2017-05-14 DIAGNOSIS — H25813 Combined forms of age-related cataract, bilateral: Secondary | ICD-10-CM | POA: Diagnosis not present

## 2017-05-14 DIAGNOSIS — H401131 Primary open-angle glaucoma, bilateral, mild stage: Secondary | ICD-10-CM | POA: Diagnosis not present

## 2017-05-20 ENCOUNTER — Encounter: Payer: Self-pay | Admitting: Family Medicine

## 2017-05-20 ENCOUNTER — Other Ambulatory Visit: Payer: Self-pay | Admitting: Family

## 2017-05-21 MED ORDER — BUPROPION HCL ER (XL) 300 MG PO TB24: 300.0000 mg | ORAL_TABLET | Freq: Every day | ORAL | 0 refills | Status: DC

## 2017-05-21 MED ORDER — ALPRAZOLAM 0.5 MG PO TABS: 0.5000 mg | ORAL_TABLET | Freq: Every evening | ORAL | 0 refills | Status: DC | PRN

## 2017-05-21 NOTE — Telephone Encounter (Signed)
Pt is requesting refill on alprazolam. [place] Pt.  Last OV: 08/29/2016, no future appt scheduled Last Fill: 09/15/2014 #45 and 0RF by Melissa UDS: None seen  Please advise in PCP absence.

## 2017-05-21 NOTE — Telephone Encounter (Signed)
Rx faxed to [facility] pharmacy.  

## 2017-05-21 NOTE — Telephone Encounter (Signed)
Ok #7, further RF per PCP

## 2017-05-21 NOTE — Telephone Encounter (Signed)
Rx printed, awaiting MD signature.  

## 2017-05-23 DIAGNOSIS — F418 Other specified anxiety disorders: Secondary | ICD-10-CM | POA: Diagnosis not present

## 2017-05-23 DIAGNOSIS — M7711 Lateral epicondylitis, right elbow: Secondary | ICD-10-CM | POA: Diagnosis not present

## 2017-06-13 ENCOUNTER — Ambulatory Visit: Payer: Self-pay | Admitting: Family Medicine

## 2017-07-03 DIAGNOSIS — Z30431 Encounter for routine checking of intrauterine contraceptive device: Secondary | ICD-10-CM | POA: Diagnosis not present

## 2017-07-03 DIAGNOSIS — R87610 Atypical squamous cells of undetermined significance on cytologic smear of cervix (ASC-US): Secondary | ICD-10-CM | POA: Diagnosis not present

## 2017-07-03 DIAGNOSIS — Z01419 Encounter for gynecological examination (general) (routine) without abnormal findings: Secondary | ICD-10-CM | POA: Diagnosis not present

## 2017-07-03 DIAGNOSIS — R87810 Cervical high risk human papillomavirus (HPV) DNA test positive: Secondary | ICD-10-CM | POA: Diagnosis not present

## 2017-07-03 DIAGNOSIS — Z1151 Encounter for screening for human papillomavirus (HPV): Secondary | ICD-10-CM | POA: Diagnosis not present

## 2017-07-09 DIAGNOSIS — R87610 Atypical squamous cells of undetermined significance on cytologic smear of cervix (ASC-US): Secondary | ICD-10-CM | POA: Diagnosis not present

## 2017-07-09 DIAGNOSIS — R87810 Cervical high risk human papillomavirus (HPV) DNA test positive: Secondary | ICD-10-CM | POA: Diagnosis not present

## 2017-07-09 DIAGNOSIS — N8501 Benign endometrial hyperplasia: Secondary | ICD-10-CM | POA: Diagnosis not present

## 2017-07-09 DIAGNOSIS — N870 Mild cervical dysplasia: Secondary | ICD-10-CM | POA: Diagnosis not present

## 2017-08-05 ENCOUNTER — Other Ambulatory Visit: Payer: Self-pay | Admitting: Family

## 2017-08-05 DIAGNOSIS — Z1231 Encounter for screening mammogram for malignant neoplasm of breast: Secondary | ICD-10-CM

## 2017-08-08 DIAGNOSIS — M25521 Pain in right elbow: Secondary | ICD-10-CM | POA: Diagnosis not present

## 2017-08-08 DIAGNOSIS — M7711 Lateral epicondylitis, right elbow: Secondary | ICD-10-CM | POA: Diagnosis not present

## 2017-08-15 DIAGNOSIS — M25521 Pain in right elbow: Secondary | ICD-10-CM | POA: Diagnosis not present

## 2017-08-22 DIAGNOSIS — M25521 Pain in right elbow: Secondary | ICD-10-CM | POA: Diagnosis not present

## 2017-08-29 DIAGNOSIS — M25521 Pain in right elbow: Secondary | ICD-10-CM | POA: Diagnosis not present

## 2017-09-05 ENCOUNTER — Ambulatory Visit (HOSPITAL_BASED_OUTPATIENT_CLINIC_OR_DEPARTMENT_OTHER)
Admission: RE | Admit: 2017-09-05 | Discharge: 2017-09-05 | Disposition: A | Discharge status: Home / Self Care | Payer: BLUE CROSS/BLUE SHIELD | Source: Ambulatory Visit | Attending: Family | Admitting: Family

## 2017-09-05 ENCOUNTER — Encounter (HOSPITAL_BASED_OUTPATIENT_CLINIC_OR_DEPARTMENT_OTHER): Payer: Self-pay

## 2017-09-05 DIAGNOSIS — Z1231 Encounter for screening mammogram for malignant neoplasm of breast: Secondary | ICD-10-CM | POA: Insufficient documentation

## 2017-09-09 DIAGNOSIS — M25521 Pain in right elbow: Secondary | ICD-10-CM | POA: Diagnosis not present

## 2017-12-03 DIAGNOSIS — H25813 Combined forms of age-related cataract, bilateral: Secondary | ICD-10-CM | POA: Diagnosis not present

## 2017-12-03 DIAGNOSIS — H401131 Primary open-angle glaucoma, bilateral, mild stage: Secondary | ICD-10-CM | POA: Diagnosis not present

## 2017-12-17 NOTE — Progress Notes (Signed)
[facility] [address], [address] [place], [place] [address] [phone] [phone]  Date:  12/19/2017   Name:  Cindy MedicusKarla G Kelly   DOB:  08/31/1966   MRN:  [id number]  PCP:  Pearline Cablesopland, Tyjah Hai C, MD    Chief Complaint: Insomnia (hx of insomnia)   History of Present Illness:  Cindy Kelly is a 52 y.o. very pleasant female patient who presents with the following:  Here today with concern of insomnia, depression/ anxiety I last saw her in 11/17 for a UTI  About 10 years ago when she was getting divorced she started to have issues with depression/ anxiety She tried a lot of different medications.  She however then seemed to get better- as of a couple of years ago she was actually doing well.  However, a year ago her daughter joined the [employer] and her sx came back She has a lot of trouble sleeping and tends to stay up worrying  She has tried unisom, melatonin, benadryl, zzyquil, vitamins- everything she could find OTC- nothing has provided her with lasting relief.   She has some difficulty getting through her day due to lack of sleep.  Feels like she is not as sharp mentally or emotionally due to fatigeu She feels worried all the time about her daughter She is also sad and misses her daughter- her youngest daughter is with her however, and she did get remarried which is a good thing  She feels like "there is an empty spot in my heart." She does have some anhedonia Her energy level is low- although she is exhausted she is sleeping sometimes just a few hours at a night   No SI or HI  She would like to go back on medication to help with her sx at this time She was going to a support group for [employer] parents a while ago- she may start doing this again    Patient Active Problem List   Diagnosis Date Noted  . Acne 12/09/2015  . Perioral dermatitis 01/19/2015  . Vaginitis and vulvovaginitis 01/19/2015  . Nausea alone 07/13/2014  .  Fatigue 07/13/2014  . Insomnia 05/21/2014  . Elevated IOP 05/18/2014  . Situational insomnia 06/29/2011  . Anxiety and depression 06/29/2011    Past Medical History:  Diagnosis Date  . Depression   . Insomnia     Past Surgical History:  Procedure Laterality Date  . AUGMENTATION MAMMAPLASTY    . LITHOTRIPSY  2007-2015   x 3    Social History   Tobacco Use  . Smoking status: Never Smoker  . Smokeless tobacco: Never Used  Substance Use Topics  . Alcohol use: No  . Drug use: No    Family History  Problem Relation Age of Onset  . Kidney disease Father   . Glaucoma Father     Allergies  Allergen Reactions  . Aripiprazole     Headache, nausea, unable to sleep    Medication list has been reviewed and updated.  Current Outpatient Medications on File Prior to Visit  Medication Sig Dispense Refill  . SIMBRINZA 1-0.2 % SUSP Place 1 drop into both eyes 3 (three) times daily.     No current facility-administered medications on file prior to visit.     Review of Systems:  As per HPI- otherwise negative.   Physical Examination: Vitals:   12/19/17 1736  BP: 110/82  Pulse: (!) 105  Temp: 98.2 F (36.8 C)  SpO2: 98%   Vitals:   12/19/17 1736  Weight: 140 lb 6.4 oz (63.7 kg)   Body mass index is 25.68 kg/m. Ideal Body Weight:    GEN: WDWN, NAD, Non-toxic, A & O x 3, normal weight, looks well but tearful HEENT: Atraumatic, Normocephalic. Neck supple. No masses, No LAD. Ears and Nose: No external deformity. CV: RRR, No M/G/R. No JVD. No thrill. No extra heart sounds. PULM: CTA B, no wheezes, crackles, rhonchi. No retractions. No resp. distress. No accessory muscle use. ABD: S, NT, ND EXTR: No c/c/e NEURO Normal gait.  PSYCH: Normally interactive. Conversant.   Assessment and Plan: Adjustment reaction with anxiety and depression - Plan: FLUoxetine (PROZAC) 20 MG tablet, ALPRAZolam (XANAX) 0.5 MG tablet  Acute stress reaction  Adjustment insomnia -  Plan: ALPRAZolam (XANAX) 0.5 MG tablet  Here today to follow-up and discuss depression/ anxiety/ insomnia.  She has suffered from this in the past, latest episode seems to be triggered by her daughter joining the [employer] Will start her on prozac 20 for her depression and anxiety Xanax for insomnia- do not plan to keep her on this long term  See patient instructions for more details.      Signed Abbe Amsterdam, MD  Meds ordered this encounter  Medications  . FLUoxetine (PROZAC) 20 MG tablet    Sig: Take 1 tablet (20 mg total) by mouth daily. After a week increase to 2 tablets daily    Dispense:  60 tablet    Refill:  5  . ALPRAZolam (XANAX) 0.5 MG tablet    Sig: Take 1/2 at bedtime as needed for sleep. May increase to a whole tablet if needed    Dispense:  30 tablet    Refill:  0  ]

## 2017-12-19 ENCOUNTER — Encounter: Payer: Self-pay | Admitting: Family Medicine

## 2017-12-19 ENCOUNTER — Ambulatory Visit: Payer: BLUE CROSS/BLUE SHIELD | Admitting: Family Medicine

## 2017-12-19 VITALS — BP 110/82 | HR 105 | Temp 98.2°F | Wt 140.4 lb

## 2017-12-19 DIAGNOSIS — F430 Acute stress reaction: Secondary | ICD-10-CM

## 2017-12-19 DIAGNOSIS — F4323 Adjustment disorder with mixed anxiety and depressed mood: Secondary | ICD-10-CM | POA: Diagnosis not present

## 2017-12-19 DIAGNOSIS — F5102 Adjustment insomnia: Secondary | ICD-10-CM

## 2017-12-19 MED ORDER — ALPRAZOLAM 0.5 MG PO TABS: ORAL_TABLET | ORAL | 0 refills | Status: DC

## 2017-12-19 MED ORDER — FLUOXETINE HCL 20 MG PO TABS: 20.0000 mg | ORAL_TABLET | Freq: Every day | ORAL | 5 refills | Status: DC

## 2017-12-19 NOTE — Patient Instructions (Signed)
I am sorry that you are having a hard time.  You might try going back to your support group for mothers of children in the [employer].  We will try prozac 20 mg for depression/ anxiety.  Start with 20 mg, increase to 40 after a week We can also have you try alprazolam as needed for sleep- don't drive after taking this medication however Please come and see me in about 3 weeks to check on how you are doing- Sooner if worse.

## 2018-01-03 ENCOUNTER — Other Ambulatory Visit: Payer: Self-pay | Admitting: Family Medicine

## 2018-01-03 DIAGNOSIS — F4323 Adjustment disorder with mixed anxiety and depressed mood: Secondary | ICD-10-CM

## 2018-01-03 DIAGNOSIS — F5102 Adjustment insomnia: Secondary | ICD-10-CM

## 2018-01-06 MED ORDER — ALPRAZOLAM 0.5 MG PO TABS: ORAL_TABLET | ORAL | 0 refills | Status: DC

## 2018-01-06 NOTE — Telephone Encounter (Signed)
Requesting: ALPRAZolam (XANAX) 0.5 MG tablet Contract UDS Last OV : 12/19/17 Last Refill: 12/19/17  Please Advise

## 2018-01-14 DIAGNOSIS — H25813 Combined forms of age-related cataract, bilateral: Secondary | ICD-10-CM | POA: Diagnosis not present

## 2018-01-14 DIAGNOSIS — H401131 Primary open-angle glaucoma, bilateral, mild stage: Secondary | ICD-10-CM | POA: Diagnosis not present

## 2018-01-18 ENCOUNTER — Encounter: Payer: Self-pay | Admitting: Family Medicine

## 2018-01-18 DIAGNOSIS — F4323 Adjustment disorder with mixed anxiety and depressed mood: Secondary | ICD-10-CM

## 2018-01-21 MED ORDER — ESCITALOPRAM OXALATE 10 MG PO TABS: 10.0000 mg | ORAL_TABLET | Freq: Every day | ORAL | 6 refills | Status: DC

## 2018-01-21 NOTE — Addendum Note (Signed)
Addended by: Abbe AmsterdamOPLAND, JESSICA C on: 01/21/2018 12:51 PM   Modules accepted: Orders

## 2018-01-27 DIAGNOSIS — Z0000 Encounter for general adult medical examination without abnormal findings: Secondary | ICD-10-CM | POA: Diagnosis not present

## 2018-02-09 ENCOUNTER — Other Ambulatory Visit: Payer: Self-pay | Admitting: Family Medicine

## 2018-02-09 DIAGNOSIS — F5102 Adjustment insomnia: Secondary | ICD-10-CM

## 2018-02-09 DIAGNOSIS — F4323 Adjustment disorder with mixed anxiety and depressed mood: Secondary | ICD-10-CM

## 2018-02-10 MED ORDER — ALPRAZOLAM 0.5 MG PO TABS: ORAL_TABLET | ORAL | 0 refills | Status: DC

## 2018-02-10 NOTE — Telephone Encounter (Signed)
Refill request received for alprazolam 0.5mg .

## 2018-02-14 IMAGING — CT CT RENAL STONE PROTOCOL
2 of 4 series · 16 of 46 positions shown, 18 images · non-contrast
Comparison: Prior radiograph from 01/25/2009.

CLINICAL DATA: Initial evaluation for acute onset severe right
flank pain.

EXAM:
CT ABDOMEN AND PELVIS WITHOUT CONTRAST
TECHNIQUE: Multidetector CT imaging of the abdomen and pelvis was performed
following the standard protocol without IV contrast.

[Series 2: stone full standard · axial · 0.62mm/px · z∈[-1004,-644]mm · 13 of 80 slices shown, 15 images]
[im 4/80  soft-tissue]
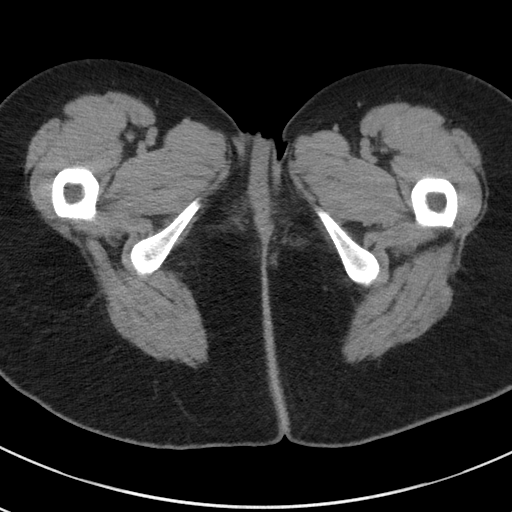
[im 4/80  bone]
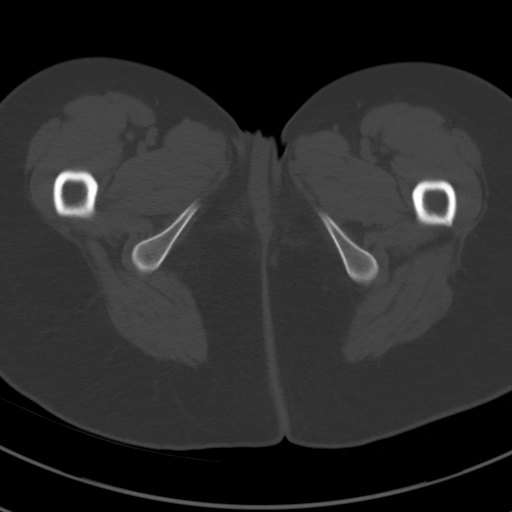
[im 10/80  soft-tissue]
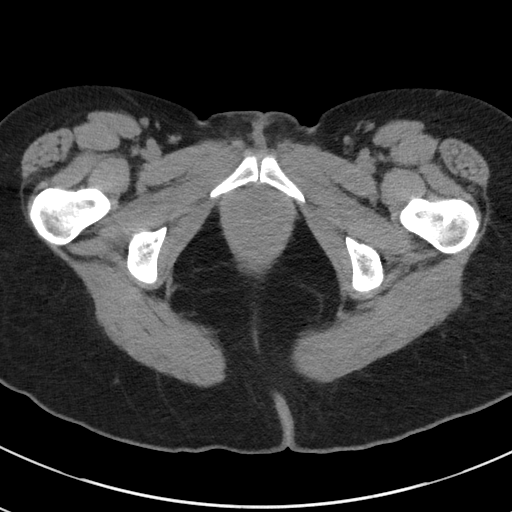
[im 16/80  soft-tissue]
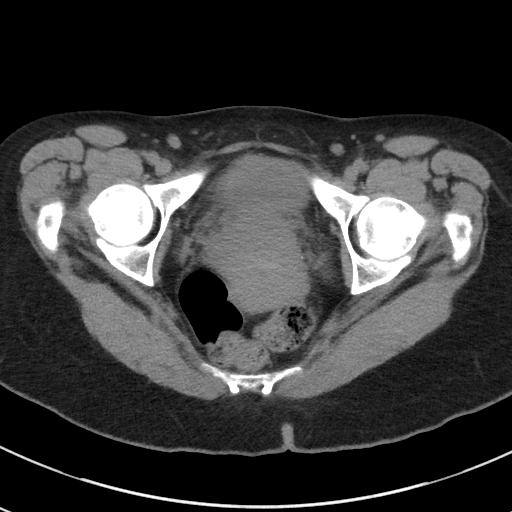
[im 23/80  soft-tissue]
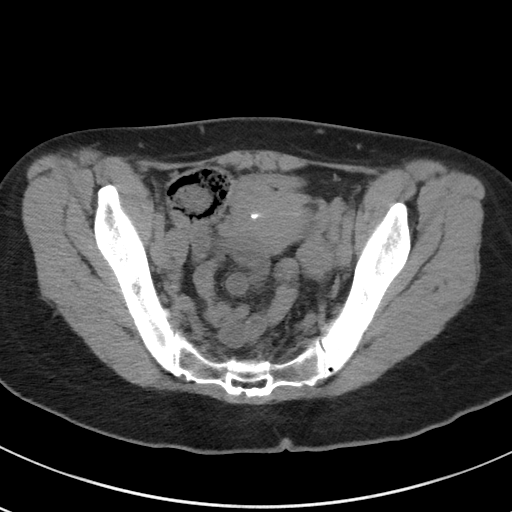
[im 29/80  soft-tissue]
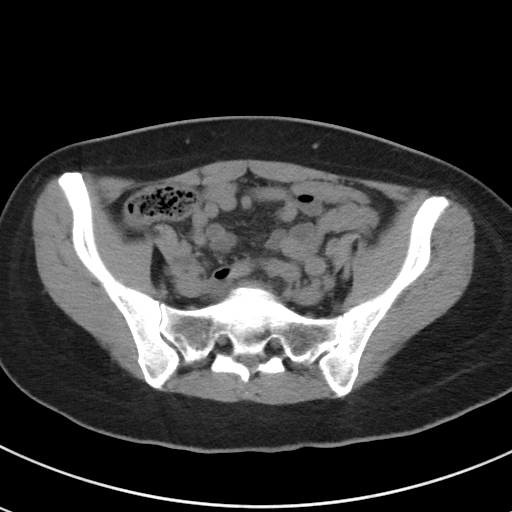
[im 35/80  soft-tissue]
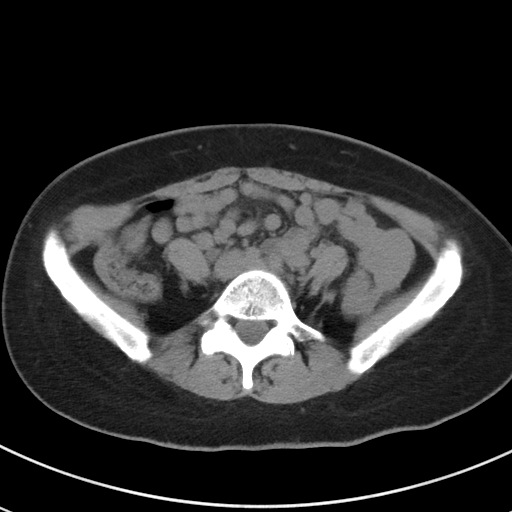
[im 42/80  soft-tissue]
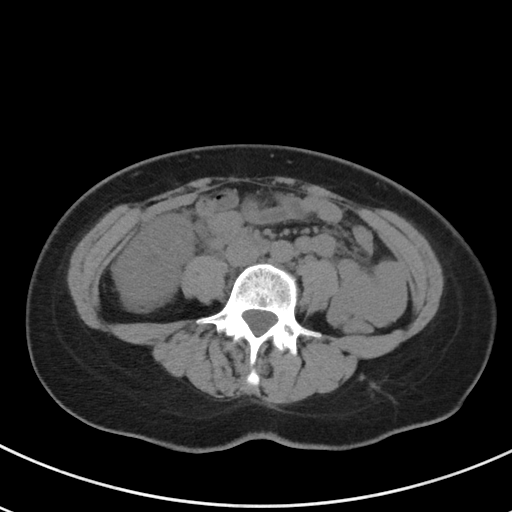
[im 45/80  soft-tissue]
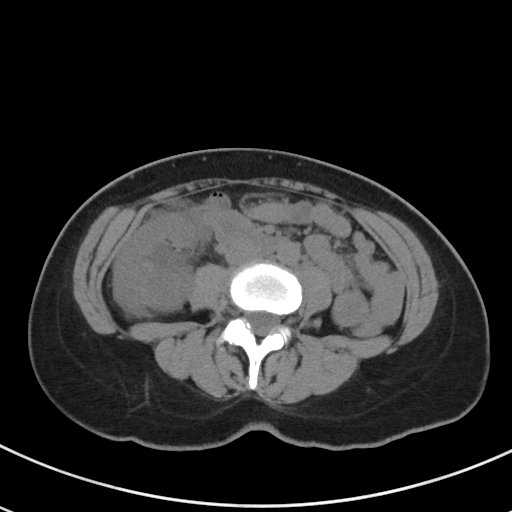
[im 51/80  soft-tissue]
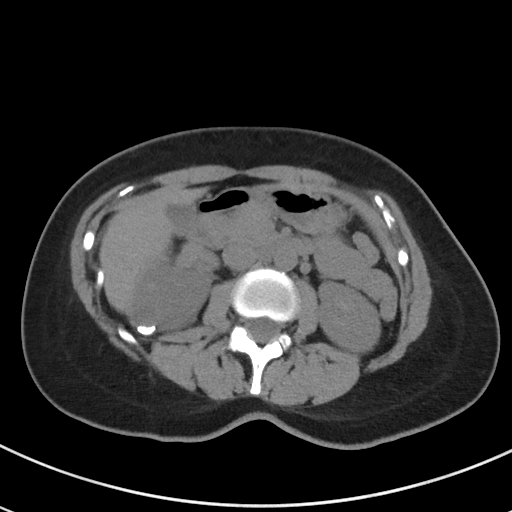
[im 51/80  bone]
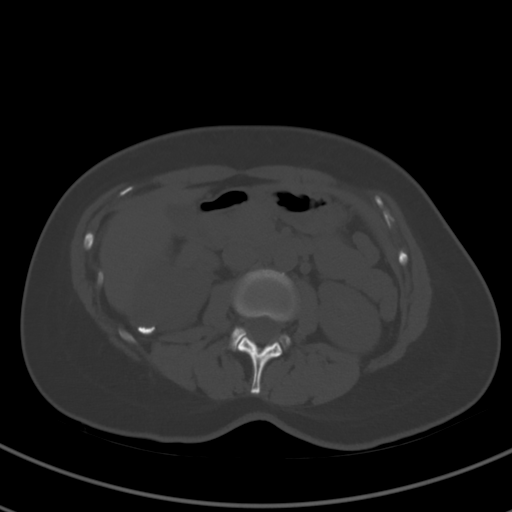
[im 57/80  soft-tissue]
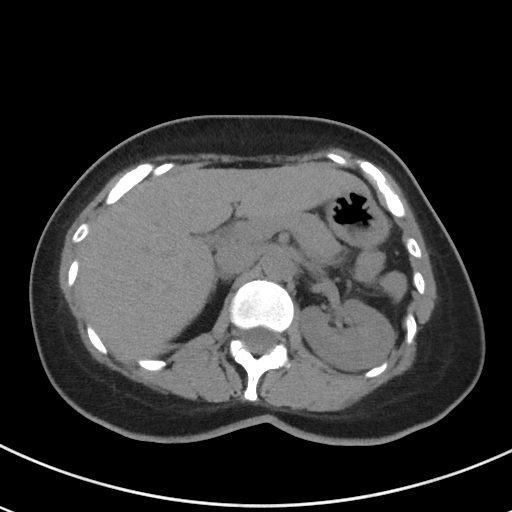
[im 64/80  soft-tissue]
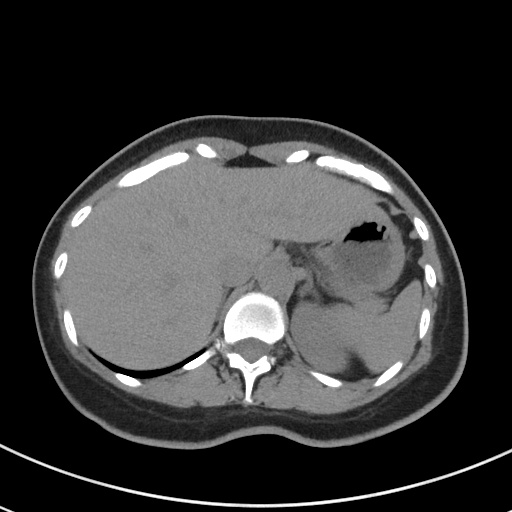
[im 70/80  soft-tissue]
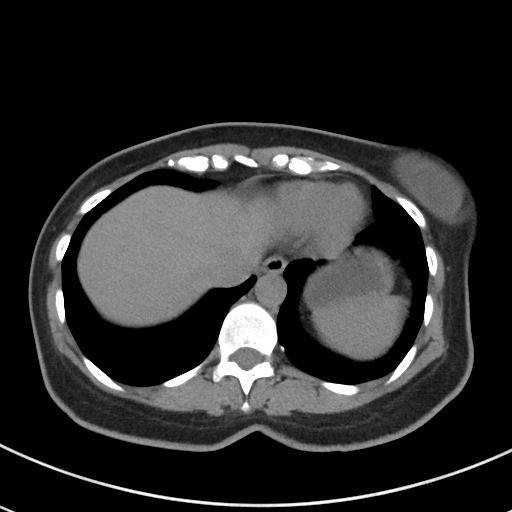
[im 76/80  soft-tissue]
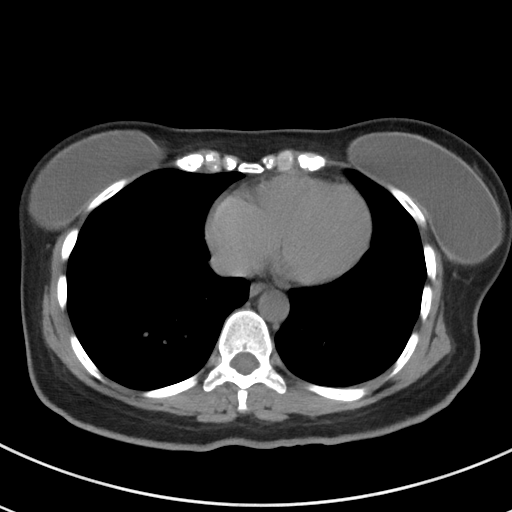

[Series 5: coronal · coronal · 0.66mm/px · 3 of 96 slices shown]
[im 32/96  soft-tissue]
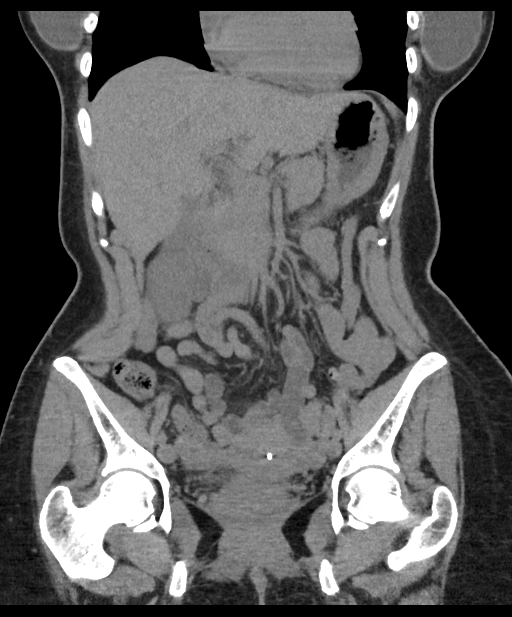
[im 43/96  soft-tissue]
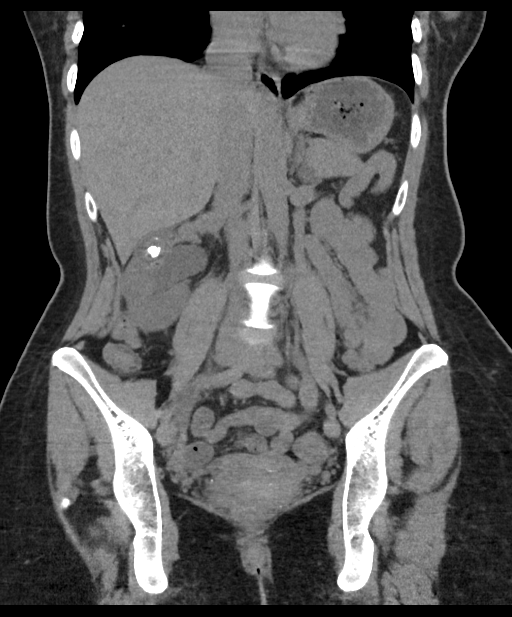
[im 53/96  soft-tissue]
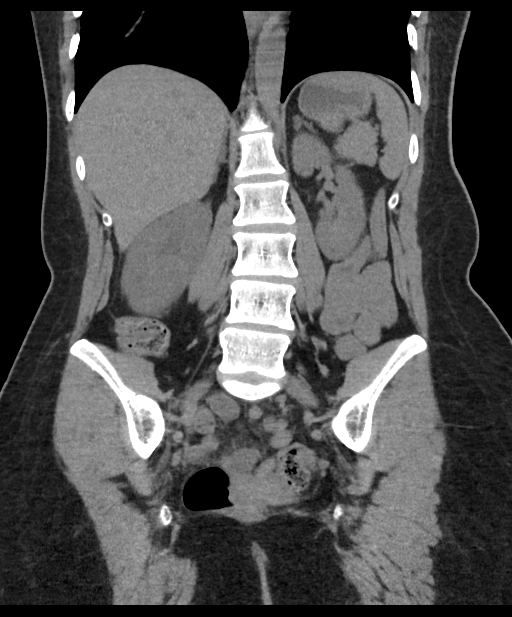

[16 of 46 positions shown; findings below may reference images not displayed]

FINDINGS: Lower chest: Visualized lung bases are clear. Bilateral breast
implants partially visualized.

Hepatobiliary: Liver within normal limits. Gallbladder normal. No
biliary dilatation.

Pancreas: Pancreas within normal limits.

Spleen: Spleen within normal limits.

Adrenals/Urinary Tract: Adrenal glands are normal. Left kidney
unremarkable without evidence for nephrolithiasis or hydronephrosis.
No radiopaque calculi seen along the course of the left renal
collecting system. No left-sided hydroureter.

On the right, there is an obstructive 4 mm stone present at the
right UVJ with secondary mild to moderate right
hydroureteronephrosis. Possible additional punctate 2 mm stone
within the distal right ureter (series 2, image 62). No other
right-sided ureteral calculi. Additional 3 mm nonobstructive stone
present within the lower pole the right kidney. Dystrophic
calcifications within the interpolar right kidney, favored to be
parenchymal, possibly related underlying cystic lesion. Additional
13 mm cystic lesion with mural calcification within the posterior
right kidney noted.

Bladder largely decompressed without acute abnormality.

Stomach/Bowel: Stomach within normal limits. No evidence for bowel
obstruction. No acute inflammatory changes seen about the bowels.

Vascular/Lymphatic: Intra-abdominal and of normal caliber. No
pathologically enlarged intra-abdominal or pelvic lymph nodes.

Reproductive: IUD in place within the uterus. Uterus and ovaries
otherwise unremarkable.

Other: No free air or fluid.

Musculoskeletal: No acute osseous abnormality. The no worrisome
lytic or blastic osseous lesions.
IMPRESSION: 1. 4 mm obstructive stone at the right UVJ with secondary mild to
moderate right hydroureteronephrosis.
2. Probable additional punctate 2 mm stone within the distal right
ureter.
3. Additional nonobstructive right renal nephrolithiasis as above.
4. Right renal cysts with associated calcifications, not well
evaluated on this noncontrast examination.
5. No other acute intra-abdominal or pelvic process.

## 2018-03-10 ENCOUNTER — Other Ambulatory Visit: Payer: Self-pay | Admitting: Family Medicine

## 2018-03-10 DIAGNOSIS — F5102 Adjustment insomnia: Secondary | ICD-10-CM

## 2018-03-10 DIAGNOSIS — F4323 Adjustment disorder with mixed anxiety and depressed mood: Secondary | ICD-10-CM

## 2018-03-11 ENCOUNTER — Encounter: Payer: Self-pay | Admitting: Family Medicine

## 2018-03-11 MED ORDER — ALPRAZOLAM 0.5 MG PO TABS: ORAL_TABLET | ORAL | 0 refills | Status: DC

## 2018-03-11 NOTE — Telephone Encounter (Signed)
Request for alprazolam 0.5mg .

## 2018-04-09 ENCOUNTER — Other Ambulatory Visit: Payer: Self-pay | Admitting: Family Medicine

## 2018-04-09 DIAGNOSIS — F5102 Adjustment insomnia: Secondary | ICD-10-CM

## 2018-04-09 DIAGNOSIS — F4323 Adjustment disorder with mixed anxiety and depressed mood: Secondary | ICD-10-CM

## 2018-04-10 MED ORDER — ALPRAZOLAM 0.5 MG PO TABS: ORAL_TABLET | ORAL | 2 refills | Status: DC

## 2018-04-10 NOTE — Telephone Encounter (Signed)
Refill request for alprazolam 0.5mg. 

## 2018-04-29 ENCOUNTER — Encounter: Payer: Self-pay | Admitting: Family Medicine

## 2018-04-29 DIAGNOSIS — Z1211 Encounter for screening for malignant neoplasm of colon: Secondary | ICD-10-CM

## 2018-04-30 ENCOUNTER — Encounter: Payer: Self-pay | Admitting: Gastroenterology

## 2018-05-20 DIAGNOSIS — H401131 Primary open-angle glaucoma, bilateral, mild stage: Secondary | ICD-10-CM | POA: Diagnosis not present

## 2018-05-20 DIAGNOSIS — H25813 Combined forms of age-related cataract, bilateral: Secondary | ICD-10-CM | POA: Diagnosis not present

## 2018-05-30 ENCOUNTER — Encounter: Payer: Self-pay | Admitting: Family Medicine

## 2018-06-05 ENCOUNTER — Encounter: Payer: Self-pay | Admitting: Family Medicine

## 2018-06-10 DIAGNOSIS — H25813 Combined forms of age-related cataract, bilateral: Secondary | ICD-10-CM | POA: Diagnosis not present

## 2018-06-10 DIAGNOSIS — H401131 Primary open-angle glaucoma, bilateral, mild stage: Secondary | ICD-10-CM | POA: Diagnosis not present

## 2018-06-17 ENCOUNTER — Ambulatory Visit (AMBULATORY_SURGERY_CENTER): Payer: Self-pay | Admitting: *Deleted

## 2018-06-17 ENCOUNTER — Encounter: Payer: Self-pay | Admitting: Gastroenterology

## 2018-06-17 VITALS — Ht 62.0 in | Wt 140.4 lb

## 2018-06-17 DIAGNOSIS — Z1211 Encounter for screening for malignant neoplasm of colon: Secondary | ICD-10-CM

## 2018-06-17 MED ORDER — NA SULFATE-K SULFATE-MG SULF 17.5-3.13-1.6 GM/177ML PO SOLN: 1.0000 | Freq: Once | ORAL | 0 refills | Status: AC

## 2018-06-17 NOTE — Progress Notes (Signed)
Denies allergies to eggs or soy products. Denies complications with sedation or anesthesia. Denies O2 use. Denies use of diet or weight loss medications.  Emmi instructions declined for colonoscopy, It will spook me out

## 2018-06-27 DIAGNOSIS — Z3202 Encounter for pregnancy test, result negative: Secondary | ICD-10-CM | POA: Diagnosis not present

## 2018-06-27 DIAGNOSIS — Z124 Encounter for screening for malignant neoplasm of cervix: Secondary | ICD-10-CM | POA: Diagnosis not present

## 2018-06-27 DIAGNOSIS — Z30433 Encounter for removal and reinsertion of intrauterine contraceptive device: Secondary | ICD-10-CM | POA: Diagnosis not present

## 2018-07-01 ENCOUNTER — Ambulatory Visit (AMBULATORY_SURGERY_CENTER): Payer: BLUE CROSS/BLUE SHIELD | Admitting: Gastroenterology

## 2018-07-01 ENCOUNTER — Encounter: Payer: Self-pay | Admitting: Gastroenterology

## 2018-07-01 VITALS — BP 100/61 | HR 52 | Temp 98.7°F | Resp 16 | Ht 62.0 in | Wt 140.0 lb

## 2018-07-01 DIAGNOSIS — Z1211 Encounter for screening for malignant neoplasm of colon: Secondary | ICD-10-CM

## 2018-07-01 MED ORDER — SODIUM CHLORIDE 0.9 % IV SOLN
500.0000 mL | Freq: Once | INTRAVENOUS | Status: DC
Start: 2018-07-01 — End: 2018-07-01

## 2018-07-01 NOTE — Progress Notes (Signed)
To PACU, vss patent aw report to rn 

## 2018-07-01 NOTE — Progress Notes (Deleted)
[facility] [address], [address] [place], [place] [address] [phone]  Date:  07/03/2018   Name:  Cindy Kelly   DOB:  May 22, 1966   MRN:  [id number]  PCP:  Pearline Cables, MD    Chief Complaint: No chief complaint on file.   History of Present Illness:  Cindy Kelly is a 52 y.o. very pleasant female patient who presents with the following:  Last seen here in February:  Here today to follow-up and discuss depression/ anxiety/ insomnia.  She has suffered from this in the past, latest episode seems to be triggered by her daughter joining the [employer] Will start her on prozac 20 for her depression and anxiety Xanax for insomnia- do not plan to keep her on this long term  Patient Active Problem List   Diagnosis Date Noted  . Acne 12/09/2015  . Perioral dermatitis 01/19/2015  . Vaginitis and vulvovaginitis 01/19/2015  . Nausea alone 07/13/2014  . Fatigue 07/13/2014  . Insomnia 05/21/2014  . Elevated IOP 05/18/2014  . Situational insomnia 06/29/2011  . Anxiety and depression 06/29/2011    Past Medical History:  Diagnosis Date  . Depression   . Insomnia   . Kidney stones     Past Surgical History:  Procedure Laterality Date  . AUGMENTATION MAMMAPLASTY    . LITHOTRIPSY  2007-2015   x 3    Social History   Tobacco Use  . Smoking status: Never Smoker  . Smokeless tobacco: Never Used  Substance Use Topics  . Alcohol use: No  . Drug use: No    Family History  Problem Relation Age of Onset  . Kidney disease Father   . Glaucoma Father   . Colon cancer Neg Hx   . Esophageal cancer Neg Hx   . Rectal cancer Neg Hx   . Stomach cancer Neg Hx     Allergies  Allergen Reactions  . Aripiprazole     Headache, nausea, unable to sleep    Medication list has been reviewed and updated.  Current Outpatient Medications on File Prior to Visit  Medication Sig Dispense Refill  . ALPRAZolam (XANAX) 0.5 MG  tablet Take 1/2 at bedtime as needed for sleep. May increase to a whole tablet if needed 30 tablet 2  . SIMBRINZA 1-0.2 % SUSP Place 1 drop into both eyes 3 (three) times daily.     No current facility-administered medications on file prior to visit.     Review of Systems:  As per HPI- otherwise negative.   Physical Examination: There were no vitals filed for this visit. There were no vitals filed for this visit. There is no height or weight on file to calculate BMI. Ideal Body Weight:    GEN: WDWN, NAD, Non-toxic, A & O x 3 HEENT: Atraumatic, Normocephalic. Neck supple. No masses, No LAD. Ears and Nose: No external deformity. CV: RRR, No M/G/R. No JVD. No thrill. No extra heart sounds. PULM: CTA B, no wheezes, crackles, rhonchi. No retractions. No resp. distress. No accessory muscle use. ABD: S, NT, ND, +BS. No rebound. No HSM. EXTR: No c/c/e NEURO Normal gait.  PSYCH: Normally interactive. Conversant. Not depressed or anxious appearing.  Calm demeanor.    Assessment and Plan: ***  Signed Abbe Amsterdam, MD

## 2018-07-01 NOTE — Op Note (Signed)
[facility] Patient Name: Cindy Kelly Procedure Date: 07/01/2018 10:07 AM MRN: [id number] Endoscopist: Lynann Bologna , MD Age: 52 Referring MD:  Date of Birth: [birth year] Gender: Female Account #: [id number] Procedure:                Colonoscopy Indications:              Screening for colorectal malignant neoplasm Medicines:                Monitored Anesthesia Care Procedure:                Pre-Anesthesia Assessment:                           - Prior to the procedure, a History and Physical                            was performed, and patient medications and                            allergies were reviewed. The patient's tolerance of                            previous anesthesia was also reviewed. The risks                            and benefits of the procedure and the sedation                            options and risks were discussed with the patient.                            All questions were answered, and informed consent                            was obtained. Prior Anticoagulants: The patient has                            taken no previous anticoagulant or antiplatelet                            agents. ASA Grade Assessment: II - A patient with                            mild systemic disease. After reviewing the risks                            and benefits, the patient was deemed in                            satisfactory condition to undergo the procedure.                           After obtaining informed consent, the colonoscope  was passed under direct vision. Throughout the                            procedure, the patient's blood pressure, pulse, and                            oxygen saturations were monitored continuously. The                            Colonoscope was introduced through the anus and                            advanced to the 2 cm into the ileum. The                            colonoscopy was performed  without difficulty. The                            patient tolerated the procedure well. The quality                            of the bowel preparation was excellent. Scope In: 10:13:41 AM Scope Out: 10:23:28 AM Scope Withdrawal Time: 0 hours 6 minutes 56 seconds  Total Procedure Duration: 0 hours 9 minutes 47 seconds  Findings:                 A few small-mouthed diverticula were found in the                            sigmoid colon. There was no evidence of                            diverticular bleeding.                           Non-bleeding internal hemorrhoids were found during                            retroflexion. The hemorrhoids were small.                           The exam was otherwise without abnormality on                            direct and retroflexion views. Complications:            No immediate complications. Estimated Blood Loss:     Estimated blood loss: none. Impression:               - Mild sigmoid diverticulosis                           - Otherwise normal colonoscopy to TI. Recommendation:           - Patient has a contact number available for  emergencies. The signs and symptoms of potential                            delayed complications were discussed with the                            patient. Return to normal activities tomorrow.                            Written discharge instructions were provided to the                            patient.                           - High fiber diet.                           - Continue present medications.                           - Repeat colonoscopy in 10 years for screening                            purposes. Earlier, if she starts having any new                            problems or if there is any change in family                            history.                           - Return to GI clinic PRN. Lynann Bologna, MD 07/01/2018 10:27:47 AM This report has been signed  electronically.

## 2018-07-01 NOTE — Patient Instructions (Signed)
Discharge instructions given. Handouts on diverticulosis and hemorrhoids. Resume previous medications. YOU HAD AN ENDOSCOPIC PROCEDURE TODAY AT THE [facility]:   Refer to the procedure report that was given to you for any specific questions about what was found during the examination.  If the procedure report does not answer your questions, please call your gastroenterologist to clarify.  If you requested that your care partner not be given the details of your procedure findings, then the procedure report has been included in a sealed envelope for you to review at your convenience later.  YOU SHOULD EXPECT: Some feelings of bloating in the abdomen. Passage of more gas than usual.  Walking can help get rid of the air that was put into your GI tract during the procedure and reduce the bloating. If you had a lower endoscopy (such as a colonoscopy or flexible sigmoidoscopy) you may notice spotting of blood in your stool or on the toilet paper. If you underwent a bowel prep for your procedure, you may not have a normal bowel movement for a few days.  Please Note:  You might notice some irritation and congestion in your nose or some drainage.  This is from the oxygen used during your procedure.  There is no need for concern and it should clear up in a day or so.  SYMPTOMS TO REPORT IMMEDIATELY:   Following lower endoscopy (colonoscopy or flexible sigmoidoscopy):  Excessive amounts of blood in the stool  Significant tenderness or worsening of abdominal pains  Swelling of the abdomen that is new, acute  Fever of 100F or higher   For urgent or emergent issues, a gastroenterologist can be reached at any hour by calling [phone].   DIET:  We do recommend a small meal at first, but then you may proceed to your regular diet.  Drink plenty of fluids but you should avoid alcoholic beverages for 24 hours.  ACTIVITY:  You should plan to take it easy for the rest of today and you should  NOT DRIVE or use heavy machinery until tomorrow (because of the sedation medicines used during the test).    FOLLOW UP: Our staff will call the number listed on your records the next business day following your procedure to check on you and address any questions or concerns that you may have regarding the information given to you following your procedure. If we do not reach you, we will leave a message.  However, if you are feeling well and you are not experiencing any problems, there is no need to return our call.  We will assume that you have returned to your regular daily activities without incident.  If any biopsies were taken you will be contacted by phone or by letter within the next 1-3 weeks.  Please call us at [phone] if you have not heard about the biopsies in 3 weeks.    SIGNATURES/CONFIDENTIALITY: You and/or your care partner have signed paperwork which will be entered into your electronic medical record.  These signatures attest to the fact that that the information above on your After Visit Summary has been reviewed and is understood.  Full responsibility of the confidentiality of this discharge information lies with you and/or your care-partner. 

## 2018-07-02 ENCOUNTER — Telehealth: Payer: Self-pay

## 2018-07-02 NOTE — Telephone Encounter (Signed)
Left message

## 2018-07-02 NOTE — Telephone Encounter (Signed)
  Follow up Call-  Call Kemba Hoppes number 07/01/2018  Post procedure Call Taira Knabe phone  # [phone]  Permission to leave phone message Yes  Some recent data might be hidden     Patient questions:  Do you have a fever, pain , or abdominal swelling? No. Pain Score  0 *  Have you tolerated food without any problems? Yes.    Have you been able to return to your normal activities? Yes.    Do you have any questions about your discharge instructions: Diet   No. Medications  No. Follow up visit  No.  Do you have questions or concerns about your Care? No.  Actions: * If pain score is 4 or above: No action needed, pain <4.

## 2018-07-03 ENCOUNTER — Ambulatory Visit: Payer: BLUE CROSS/BLUE SHIELD | Admitting: Family Medicine

## 2018-07-03 ENCOUNTER — Encounter: Payer: Self-pay | Admitting: Family Medicine

## 2018-07-03 VITALS — BP 114/72 | HR 80 | Temp 98.0°F | Resp 16 | Ht 62.0 in | Wt 138.0 lb

## 2018-07-03 DIAGNOSIS — Z5181 Encounter for therapeutic drug level monitoring: Secondary | ICD-10-CM | POA: Diagnosis not present

## 2018-07-03 DIAGNOSIS — D751 Secondary polycythemia: Secondary | ICD-10-CM

## 2018-07-03 DIAGNOSIS — B351 Tinea unguium: Secondary | ICD-10-CM | POA: Diagnosis not present

## 2018-07-03 DIAGNOSIS — F5102 Adjustment insomnia: Secondary | ICD-10-CM | POA: Diagnosis not present

## 2018-07-03 MED ORDER — TERBINAFINE HCL 250 MG PO TABS: 250.0000 mg | ORAL_TABLET | Freq: Every day | ORAL | 0 refills | Status: DC

## 2018-07-03 MED ORDER — DOXEPIN HCL 10 MG PO CAPS: 10.0000 mg | ORAL_CAPSULE | Freq: Every evening | ORAL | 6 refills | Status: DC | PRN

## 2018-07-03 NOTE — Patient Instructions (Signed)
Good to see you today!  Thanks for coming in For your toenails-  Terbinafine daily for 3 months Remember it does take a lot of time for a toenail to grow out completely so this may be a slow process I am expecting the dark area to grow out towards the end of the nail as healthy new nail grows in Please keep me posted!  For sleep-  Take a 1/2 xanax at bedtime for 5 days or so to taper off this med If you need to start the doxepin while you are still on the xanax this is ok- it is a low dose.  However ideally finish the xanax before you start on doxepin We have you on a low dose of doxepin so we can increase if need be- please let me know how it works for you I'll be in touch with your labs asap

## 2018-07-03 NOTE — Progress Notes (Addendum)
[facility] [address], [address] [place], [place] [address] [phone]  Date:  07/03/2018   Name:  Cindy Kelly   DOB:  11-04-1965   MRN:  [id number]  PCP:  Pearline Cables, MD    Chief Complaint: Toe Nail  Issue (dark spot on great right toe, hit toe on tub, toe nail deformed) and Insomnia (taking alprazolam once at night for sleep, stress at work)   History of Present Illness:  Cindy Kelly is a 52 y.o. 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  The distal portion of the nail seems to be loosened from the nail bed and she thinks it is not attached distally  History of depression and anxiety - pt notes that she is using xanax for sleep daily and she knew that we did not want her to be on this med long term due to risk of dependence She has been under a a lot of stress at work here recently but does not feel depressed.  Her main concern is sleep  She is using xanax every day for sleep and it does really help, but she does not want to be on this forever She might want to try doxepin which a co-worker is using with success  Discussed in detail with pt     Patient Active Problem List   Diagnosis Date Noted  . Acne 12/09/2015  . Perioral dermatitis 01/19/2015  . Vaginitis and vulvovaginitis  01/19/2015  . Nausea alone 07/13/2014  . Fatigue 07/13/2014  . Insomnia 05/21/2014  . Elevated IOP 05/18/2014  . Situational insomnia 06/29/2011  . Anxiety and depression 06/29/2011    Past Medical History:  Diagnosis Date  . Depression   . Insomnia   . Kidney stones     Past Surgical History:  Procedure Laterality Date  . AUGMENTATION MAMMAPLASTY    . LITHOTRIPSY  2007-2015   x 3    Social History   Tobacco Use  . Smoking status: Never Smoker  . Smokeless tobacco: Never Used  Substance Use Topics  . Alcohol use: No  . Drug use: No    Family History  Problem Relation Age of Onset  . Kidney disease Father   . Glaucoma Father   . Colon cancer Neg Hx   . Esophageal cancer Neg Hx   . Rectal cancer Neg Hx   . Stomach cancer Neg Hx     Allergies  Allergen Reactions  . Aripiprazole     Headache, nausea, unable to sleep    Medication list has been reviewed and updated.  Current Outpatient Medications on File Prior to Visit  Medication Sig Dispense Refill  . ALPRAZolam (XANAX) 0.5 MG tablet Take 1/2 at bedtime  as needed for sleep. May increase to a whole tablet if needed 30 tablet 2  . SIMBRINZA 1-0.2 % SUSP Place 1 drop into both eyes 3 (three) times daily.     No current facility-administered medications on file prior to visit.     Review of Systems:  As per HPI- otherwise negative. No fever or chills No CP or SOB  Physical Examination: Vitals:   07/03/18 1741  BP: 114/72  Pulse: 80  Resp: 16  Temp: 98 F (36.7 C)  SpO2: 98%   Vitals:   07/03/18 1741  Weight: 138 lb (62.6 kg)  Height: 5\' 2"  (1.575 m)   Body mass index is 25.24 kg/m. Ideal Body Weight: Weight in (lb) to have BMI = 25: 136.4  GEN: WDWN, NAD, Non-toxic, A & O x 3, normal weight, looks well  HEENT: Atraumatic, Normocephalic. Neck supple. No masses, No LAD. Ears and Nose: No external deformity. CV: RRR, No M/G/R. No JVD. No thrill. No extra heart sounds. PULM: CTA B, no  wheezes, crackles, rhonchi. No retractions. No resp. distress. No accessory muscle use. EXTR: No c/c/e NEURO Normal gait.  PSYCH: Normally interactive. Conversant. Not depressed or anxious appearing.  Calm demeanor.  Right great toenail: somewhat difficult to examine nail as there is a thick patch of acrylic across the mid nail which obscures the view under the nail.  However she does appear to have a possible old hematoma under the nail and some loosening of the distal nailbed attachment as may be seen with trauma and fungal infiltration   Assessment and Plan: Onychomycosis - Plan: terbinafine (LAMISIL) 250 MG tablet  Medication monitoring encounter - Plan: CBC, Comprehensive metabolic panel  Adjustment insomnia - Plan: doxepin (SINEQUAN) 10 MG capsule  Treat for possible onychomycosis with lamisil for 3 months, obtain labs as above first Plan to taper her off xanax and onto doxepin at 10 mg   Signed Abbe Amsterdam, MD  Received her labs 9/7- message to pt   Your metabolic profile- liver and kidney function- are quite normal Your blood counts are also fine except your hemoglobin is slightly high- this is like the opposite of anemia. This is likely harmless variation, but I would recommend that you have this rechecked in a month or so to make sure it resolves.  I will order this for you to have done as a lab visit only  Results for orders placed or performed in visit on 07/03/18  CBC  Result Value Ref Range   WBC 6.7 4.0 - 10.5 K/uL   RBC 5.19 (H) 3.87 - 5.11 Mil/uL   Platelets 303.0 150.0 - 400.0 K/uL   Hemoglobin 15.5 (H) 12.0 - 15.0 g/dL   HCT 97.9 48.0 - 16.5 %   MCV 88.2 78.0 - 100.0 fl   MCHC 33.7 30.0 - 36.0 g/dL   RDW 53.7 48.2 - 70.7 %  Comprehensive metabolic panel  Result Value Ref Range   Sodium 140 135 - 145 mEq/L   Potassium 4.3 3.5 - 5.1 mEq/L   Chloride 102 96 - 112 mEq/L   CO2 31 19 - 32 mEq/L   Glucose, Bld 90 70 - 99 mg/dL   BUN 18 6 - 23 mg/dL    Creatinine, Ser 8.67 0.40 - 1.20 mg/dL   Total Bilirubin 0.6 0.2 - 1.2 mg/dL   Alkaline Phosphatase 92 39 - 117 U/L   AST 16 0 - 37 U/L   ALT 16 0 - 35 U/L   Total  Protein 7.4 6.0 - 8.3 g/dL   Albumin 4.5 3.5 - 5.2 g/dL   Calcium 9.9 8.4 - 81.1 mg/dL   GFR 91.47 >82.95 mL/min

## 2018-07-04 LAB — COMPREHENSIVE METABOLIC PANEL
ALBUMIN: 4.5 g/dL (ref 3.5–5.2)
ALK PHOS: 92 U/L (ref 39–117)
ALT: 16 U/L (ref 0–35)
AST: 16 U/L (ref 0–37)
BUN: 18 mg/dL (ref 6–23)
CALCIUM: 9.9 mg/dL (ref 8.4–10.5)
CHLORIDE: 102 meq/L (ref 96–112)
CO2: 31 mEq/L (ref 19–32)
CREATININE: 0.85 mg/dL (ref 0.40–1.20)
GFR: 75.2 mL/min (ref >60.00)
Glucose, Bld: 90 mg/dL (ref 70–99)
POTASSIUM: 4.3 meq/L (ref 3.5–5.1)
Sodium: 140 mEq/L (ref 135–145)
Total Bilirubin: 0.6 mg/dL (ref 0.2–1.2)
Total Protein: 7.4 g/dL (ref 6.0–8.3)

## 2018-07-04 LAB — CBC
HEMATOCRIT: 45.8 % (ref 36.0–46.0)
Hemoglobin: 15.5 g/dL — ABNORMAL HIGH (ref 12.0–15.0)
MCHC: 33.7 g/dL (ref 30.0–36.0)
MCV: 88.2 fl (ref 78.0–100.0)
PLATELETS: 303 10*3/uL (ref 150.0–400.0)
RBC: 5.19 Mil/uL — ABNORMAL HIGH (ref 3.87–5.11)
RDW: 13.4 % (ref 11.5–15.5)
WBC: 6.7 10*3/uL (ref 4.0–10.5)

## 2018-07-05 ENCOUNTER — Encounter: Payer: Self-pay | Admitting: Family Medicine

## 2018-07-05 NOTE — Addendum Note (Signed)
Addended by: Abbe Amsterdam C on: 07/05/2018 06:02 AM   Modules accepted: Orders

## 2018-07-06 ENCOUNTER — Encounter: Payer: Self-pay | Admitting: Family Medicine

## 2018-07-06 DIAGNOSIS — F5102 Adjustment insomnia: Secondary | ICD-10-CM

## 2018-07-06 DIAGNOSIS — F4323 Adjustment disorder with mixed anxiety and depressed mood: Secondary | ICD-10-CM

## 2018-07-08 MED ORDER — ALPRAZOLAM 0.5 MG PO TABS: ORAL_TABLET | ORAL | 2 refills | Status: DC

## 2018-07-08 NOTE — Addendum Note (Signed)
Addended by: Abbe Amsterdam C on: 07/08/2018 01:25 PM   Modules accepted: Orders

## 2018-08-27 ENCOUNTER — Other Ambulatory Visit: Payer: Self-pay | Admitting: Family Medicine

## 2018-08-27 ENCOUNTER — Other Ambulatory Visit: Payer: Self-pay | Admitting: Family

## 2018-08-27 DIAGNOSIS — Z1231 Encounter for screening mammogram for malignant neoplasm of breast: Secondary | ICD-10-CM

## 2018-09-04 ENCOUNTER — Encounter: Payer: Self-pay | Admitting: Family Medicine

## 2018-09-04 DIAGNOSIS — F5102 Adjustment insomnia: Secondary | ICD-10-CM

## 2018-09-04 DIAGNOSIS — F4323 Adjustment disorder with mixed anxiety and depressed mood: Secondary | ICD-10-CM

## 2018-09-05 MED ORDER — DOXEPIN HCL 10 MG PO CAPS: 10.0000 mg | ORAL_CAPSULE | Freq: Every evening | ORAL | 6 refills | Status: DC | PRN

## 2018-09-06 MED ORDER — ALPRAZOLAM 0.5 MG PO TABS: ORAL_TABLET | ORAL | 2 refills | Status: DC

## 2018-09-06 NOTE — Addendum Note (Signed)
Addended by: Abbe Amsterdam C on: 09/06/2018 07:03 AM   Modules accepted: Orders

## 2018-09-09 ENCOUNTER — Encounter (HOSPITAL_BASED_OUTPATIENT_CLINIC_OR_DEPARTMENT_OTHER): Payer: Self-pay

## 2018-09-09 ENCOUNTER — Ambulatory Visit (HOSPITAL_BASED_OUTPATIENT_CLINIC_OR_DEPARTMENT_OTHER)
Admission: RE | Admit: 2018-09-09 | Discharge: 2018-09-09 | Disposition: A | Discharge status: Home / Self Care | Payer: BLUE CROSS/BLUE SHIELD | Source: Ambulatory Visit | Attending: Family Medicine | Admitting: Family Medicine

## 2018-09-09 DIAGNOSIS — Z1231 Encounter for screening mammogram for malignant neoplasm of breast: Secondary | ICD-10-CM | POA: Diagnosis not present

## 2018-10-14 DIAGNOSIS — H25813 Combined forms of age-related cataract, bilateral: Secondary | ICD-10-CM | POA: Diagnosis not present

## 2018-10-14 DIAGNOSIS — H401131 Primary open-angle glaucoma, bilateral, mild stage: Secondary | ICD-10-CM | POA: Diagnosis not present

## 2018-12-11 ENCOUNTER — Encounter: Payer: Self-pay | Admitting: Family Medicine

## 2018-12-11 DIAGNOSIS — F5102 Adjustment insomnia: Secondary | ICD-10-CM

## 2018-12-11 DIAGNOSIS — F4323 Adjustment disorder with mixed anxiety and depressed mood: Secondary | ICD-10-CM

## 2018-12-12 MED ORDER — ALPRAZOLAM 0.5 MG PO TABS: ORAL_TABLET | ORAL | 2 refills | Status: DC

## 2019-02-01 ENCOUNTER — Encounter: Payer: Self-pay | Admitting: Family Medicine

## 2019-02-10 NOTE — Patient Instructions (Addendum)
It was very nice to talk with you today  Please schedule an appointment to see me in September so we can do lab work and catch up on immunizations  If you do end up starting back on the Prozac, please send me a message and just let me know Stay well, and enjoy your new bird!   JC

## 2019-02-10 NOTE — Progress Notes (Signed)
[facility] [address] [place], [place] [address] [phone]  Date:  02/11/2019   Name:  Cindy Kelly   DOB:  1966-04-24   MRN:  [id number]  PCP:  Pearline Cables, MD    Chief Complaint: No chief complaint on file.   History of Present Illness:  Cindy Kelly is a 53 y.o. very pleasant female patient who presents with the following:  Virtual visit today due to COVID-19 pandemic Patient location is home Provider location is office Patient identity confirmed with name and date of birth, consent is given for virtual visit today  Cindy Kelly has history of insomnia, anxiety and depression I last saw her in the office in September-at that time she was using Xanax for sleep daily.  She been under a lot of stress at work, but did not really feel that she was depressed She is working from home for the last month or so and is enjoying this overall She just got a new pet and wild bird which she is really enjoying  At her last visit we planned to try to taper her off Xanax and onto doxepin at bedtime for insomnia However, she was really not able to make this transition and had to restart Xanax  She is continuing to use #45 of 0.5 mg strength alprazolam about every 30 days She takes 1.5 pills at bedtime.  She is sleeping well right now and is pleased about this Labs were done in September, looked okay She is due for tetanus and cholesterol panel when feasible  She is not taking prozac right now but thinks that she might want to go back on this if needed- she is not depressed but wonders if she might be susceptible due to current stressors  She wonders if I can send in a refill for her, which is okay  Controlled substance database is reviewed, as below  01/07/2019  2   12/12/2018  Alprazolam 0.5 MG Tablet  45.00 30 Je Cop   [id number]   [facility] ([id number])   1  1.50 LME  Comm Ins   [place]  12/12/2018  2   12/12/2018  Alprazolam 0.5 MG  Tablet  45.00 30 Je Cop   [id number]   [facility] ([id number])   0  1.50 LME  Comm Ins   [place]  11/10/2018  2   09/06/2018  Alprazolam 0.5 MG Tablet  45.00 30 Je Cop   [id number]   [facility] ([id number])   2  1.50 LME  Comm Ins   [place]  10/12/2018  2   09/06/2018  Alprazolam 0.5 MG Tablet  45.00 30 Je Cop   [id number]   [facility] ([id number])   1  1.50 LME  Comm Ins    [place]  09/14/2018  2   09/06/2018  Alprazolam 0.5 MG Tablet  45.00 30 Je Cop   [id number]   [facility] ([id number])   0  1.50 LME  Comm Ins   [place]  08/14/2018  2   07/08/2018  Alprazolam 0.5 MG Tablet  30.00 30 Je Cop   [id number]   [facility] ([id number])   1  1.00 LME  Comm Ins   [place]  07/16/2018  2   07/08/2018  Alprazolam 0.5 MG Tablet  30.00 30 Je Cop   [id number]   [facility] ([id number])   0  1.00 LME  Comm Ins   [place]  06/14/2018  2   04/10/2018  Alprazolam 0.5  MG Tablet  30.00 30 Je Cop   [id number]   [facility] [id number])   2        Patient Active Problem List   Diagnosis Date Noted  . Acne 12/09/2015  . Perioral dermatitis 01/19/2015  . Vaginitis and vulvovaginitis 01/19/2015  . Nausea alone 07/13/2014  . Fatigue 07/13/2014  . Insomnia 05/21/2014  . Elevated IOP 05/18/2014  . Situational insomnia 06/29/2011  . Anxiety and depression 06/29/2011    Past Medical History:  Diagnosis Date  . Depression   . Insomnia   . Kidney stones     Past Surgical History:  Procedure Laterality Date  . AUGMENTATION MAMMAPLASTY    . LITHOTRIPSY  2007-2015   x 3    Social History   Tobacco Use  . Smoking status: Never Smoker  . Smokeless tobacco: Never Used  Substance Use Topics  . Alcohol use: No  . Drug use: No    Family History  Problem Relation Age of Onset  . Kidney disease Father   . Glaucoma Father   . Colon cancer Neg Hx   . Esophageal cancer Neg Hx   . Rectal cancer Neg Hx   . Stomach cancer Neg Hx     Allergies  Allergen Reactions  . Aripiprazole     Headache, nausea, unable to sleep    Medication list has been reviewed and updated.  Current Outpatient Medications on File Prior to Visit  Medication Sig Dispense  Refill  . ALPRAZolam (XANAX) 0.5 MG tablet Take 1 or 1.5 tablets at bedtime as needed for sleep 45 tablet 2  . SIMBRINZA 1-0.2 % SUSP Place 1 drop into both eyes 3 (three) times daily.    Marland Kitchen. terbinafine (LAMISIL) 250 MG tablet Take 1 tablet (250 mg total) by mouth daily. Take for 3 months for toenail 90 tablet 0   No current facility-administered medications on file prior to visit.     Review of Systems:  As per HPI- otherwise negative. No cough or fever, she is feeling well  Physical Examination: There were no vitals filed for this visit. There were no vitals filed for this visit. There is no height or weight on file to calculate BMI. Ideal Body Weight:   She does not check her BP at home Pt observed over camera.  She appears well, no cough, wheezing,tachypnea or distress is observed   Assessment and Plan: Adjustment reaction with anxiety and depression - Plan: FLUoxetine (PROZAC) 20 MG tablet, ALPRAZolam (XANAX) 0.5 MG tablet  Adjustment insomnia - Plan: ALPRAZolam (XANAX) 0.5 MG tablet  Virtual visit today for medication check Overall Cindy Kelly is doing fine Refill of her xanax today, and also will send in an rx for prozac for her to have on hand in case she begins to experience depression again.  In that case, she will let me know  Otherwise, we plan to see her in the office in September for follow-up visit and labs  Summary and follow-up plan sent to patient, as below: It was very nice to talk with you today  Please schedule an appointment to see me in September so we can do lab work and catch up on immunizations  If you do end up starting back on the Prozac, please send me a message and just let me know Stay well, and enjoy your new bird!   JC    Signed Abbe AmsterdamJessica Kammi Hechler, MD

## 2019-02-11 ENCOUNTER — Other Ambulatory Visit: Payer: Self-pay

## 2019-02-11 ENCOUNTER — Encounter: Payer: Self-pay | Admitting: Family Medicine

## 2019-02-11 ENCOUNTER — Ambulatory Visit (INDEPENDENT_AMBULATORY_CARE_PROVIDER_SITE_OTHER): Payer: BLUE CROSS/BLUE SHIELD | Admitting: Family Medicine

## 2019-02-11 DIAGNOSIS — F5102 Adjustment insomnia: Secondary | ICD-10-CM

## 2019-02-11 DIAGNOSIS — F4323 Adjustment disorder with mixed anxiety and depressed mood: Secondary | ICD-10-CM | POA: Diagnosis not present

## 2019-02-11 MED ORDER — FLUOXETINE HCL 20 MG PO TABS: 20.0000 mg | ORAL_TABLET | Freq: Every day | ORAL | 5 refills | Status: DC

## 2019-02-11 MED ORDER — ALPRAZOLAM 0.5 MG PO TABS: ORAL_TABLET | ORAL | 2 refills | Status: DC

## 2019-04-07 DIAGNOSIS — H25813 Combined forms of age-related cataract, bilateral: Secondary | ICD-10-CM | POA: Diagnosis not present

## 2019-04-07 DIAGNOSIS — H401131 Primary open-angle glaucoma, bilateral, mild stage: Secondary | ICD-10-CM | POA: Diagnosis not present

## 2019-04-17 ENCOUNTER — Encounter: Payer: Self-pay | Admitting: Family Medicine

## 2019-05-25 DIAGNOSIS — H04123 Dry eye syndrome of bilateral lacrimal glands: Secondary | ICD-10-CM | POA: Diagnosis not present

## 2019-05-25 DIAGNOSIS — H401131 Primary open-angle glaucoma, bilateral, mild stage: Secondary | ICD-10-CM | POA: Diagnosis not present

## 2019-05-26 DIAGNOSIS — L738 Other specified follicular disorders: Secondary | ICD-10-CM | POA: Diagnosis not present

## 2019-05-26 DIAGNOSIS — L811 Chloasma: Secondary | ICD-10-CM | POA: Diagnosis not present

## 2019-06-09 DIAGNOSIS — H04123 Dry eye syndrome of bilateral lacrimal glands: Secondary | ICD-10-CM | POA: Diagnosis not present

## 2019-06-09 DIAGNOSIS — H401131 Primary open-angle glaucoma, bilateral, mild stage: Secondary | ICD-10-CM | POA: Diagnosis not present

## 2019-06-09 DIAGNOSIS — H25813 Combined forms of age-related cataract, bilateral: Secondary | ICD-10-CM | POA: Diagnosis not present

## 2019-06-10 ENCOUNTER — Encounter: Payer: Self-pay | Admitting: Family Medicine

## 2019-06-10 DIAGNOSIS — F4323 Adjustment disorder with mixed anxiety and depressed mood: Secondary | ICD-10-CM

## 2019-06-10 DIAGNOSIS — F5102 Adjustment insomnia: Secondary | ICD-10-CM

## 2019-06-11 MED ORDER — ALPRAZOLAM 0.5 MG PO TABS: ORAL_TABLET | ORAL | 2 refills | Status: DC

## 2019-07-08 ENCOUNTER — Other Ambulatory Visit (HOSPITAL_BASED_OUTPATIENT_CLINIC_OR_DEPARTMENT_OTHER): Payer: Self-pay | Admitting: Family Medicine

## 2019-07-08 DIAGNOSIS — Z1231 Encounter for screening mammogram for malignant neoplasm of breast: Secondary | ICD-10-CM

## 2019-07-15 DIAGNOSIS — M79641 Pain in right hand: Secondary | ICD-10-CM | POA: Diagnosis not present

## 2019-07-15 DIAGNOSIS — M654 Radial styloid tenosynovitis [de Quervain]: Secondary | ICD-10-CM | POA: Diagnosis not present

## 2019-09-08 DIAGNOSIS — H401131 Primary open-angle glaucoma, bilateral, mild stage: Secondary | ICD-10-CM | POA: Diagnosis not present

## 2019-09-08 DIAGNOSIS — H04123 Dry eye syndrome of bilateral lacrimal glands: Secondary | ICD-10-CM | POA: Diagnosis not present

## 2019-09-08 DIAGNOSIS — H25813 Combined forms of age-related cataract, bilateral: Secondary | ICD-10-CM | POA: Diagnosis not present

## 2019-09-11 ENCOUNTER — Other Ambulatory Visit: Payer: Self-pay | Admitting: Family Medicine

## 2019-09-11 ENCOUNTER — Encounter: Payer: Self-pay | Admitting: Family Medicine

## 2019-09-11 DIAGNOSIS — F4323 Adjustment disorder with mixed anxiety and depressed mood: Secondary | ICD-10-CM

## 2019-09-11 DIAGNOSIS — F5102 Adjustment insomnia: Secondary | ICD-10-CM

## 2019-09-11 NOTE — Telephone Encounter (Signed)
Please see pharmacy encounter.

## 2019-09-12 ENCOUNTER — Encounter: Payer: Self-pay | Admitting: Family Medicine

## 2019-09-12 DIAGNOSIS — F4323 Adjustment disorder with mixed anxiety and depressed mood: Secondary | ICD-10-CM

## 2019-09-12 DIAGNOSIS — F5102 Adjustment insomnia: Secondary | ICD-10-CM

## 2019-09-12 MED ORDER — ALPRAZOLAM 0.5 MG PO TABS: ORAL_TABLET | ORAL | 0 refills | Status: DC

## 2019-09-22 ENCOUNTER — Other Ambulatory Visit: Payer: Self-pay

## 2019-09-22 ENCOUNTER — Encounter (HOSPITAL_BASED_OUTPATIENT_CLINIC_OR_DEPARTMENT_OTHER): Payer: Self-pay

## 2019-09-22 ENCOUNTER — Ambulatory Visit (HOSPITAL_BASED_OUTPATIENT_CLINIC_OR_DEPARTMENT_OTHER)
Admission: RE | Admit: 2019-09-22 | Discharge: 2019-09-22 | Disposition: A | Discharge status: Home / Self Care | Payer: BC Managed Care – PPO | Source: Ambulatory Visit | Attending: Family Medicine | Admitting: Family Medicine

## 2019-09-22 DIAGNOSIS — Z1231 Encounter for screening mammogram for malignant neoplasm of breast: Secondary | ICD-10-CM | POA: Diagnosis not present

## 2019-09-23 ENCOUNTER — Other Ambulatory Visit: Payer: Self-pay | Admitting: Family Medicine

## 2019-09-23 DIAGNOSIS — R928 Other abnormal and inconclusive findings on diagnostic imaging of breast: Secondary | ICD-10-CM

## 2019-09-23 DIAGNOSIS — H401131 Primary open-angle glaucoma, bilateral, mild stage: Secondary | ICD-10-CM | POA: Diagnosis not present

## 2019-09-23 DIAGNOSIS — H04123 Dry eye syndrome of bilateral lacrimal glands: Secondary | ICD-10-CM | POA: Diagnosis not present

## 2019-09-23 DIAGNOSIS — H25813 Combined forms of age-related cataract, bilateral: Secondary | ICD-10-CM | POA: Diagnosis not present

## 2019-09-30 ENCOUNTER — Ambulatory Visit
Admission: RE | Admit: 2019-09-30 | Discharge: 2019-09-30 | Disposition: A | Discharge status: Home / Self Care | Payer: BC Managed Care – PPO | Source: Ambulatory Visit | Attending: Family Medicine | Admitting: Family Medicine

## 2019-09-30 ENCOUNTER — Other Ambulatory Visit: Payer: Self-pay

## 2019-09-30 DIAGNOSIS — R928 Other abnormal and inconclusive findings on diagnostic imaging of breast: Secondary | ICD-10-CM

## 2019-09-30 DIAGNOSIS — N6012 Diffuse cystic mastopathy of left breast: Secondary | ICD-10-CM | POA: Diagnosis not present

## 2019-09-30 DIAGNOSIS — N6011 Diffuse cystic mastopathy of right breast: Secondary | ICD-10-CM | POA: Diagnosis not present

## 2019-10-10 NOTE — Progress Notes (Signed)
[place] at [facility] [address], [place], [place] [address] [phone]  Date:  10/12/2019   Name:  Cindy Kelly   DOB:  08-25-1966   MRN:  [id number]  PCP:  Darreld Mclean, MD    Chief Complaint: No chief complaint on file.   History of Present Illness:  Cindy Kelly is a 53 y.o. 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  However, she feels that she is managing her stress okay She did take prozac for a bit of time, but then stopped She is using xanax at bedtime for sleep nightly- she will need refills soon  She has not had her flu shot- I encouraged her to do so, discussed with her in detail She is due for labs -I will order blood work for her, she may come in for a lab draw only at her convenience  She had a scare on her recent mammogram - however her ultrasound follow-up was negative  09/12/2019  1   09/12/2019  Alprazolam 0.5 MG Tablet  45.00  30 Je Cop   [id number]   Wal ([id number])   0  1.50 LME  Comm Ins   [place]  08/06/2019  1   06/11/2019  Alprazolam 0.5 MG Tablet  45.00  30 Je Cop   [id number]   Wal ([id number])   2  1.50 LME  Comm Ins   [place]  07/10/2019  1   06/11/2019  Alprazolam 0.5 MG Tablet  45.00  30 Je  Cop   [id number]   Wal ([id number])   1  1.50 LME  Comm Ins   [place]  06/11/2019  1   06/11/2019  Alprazolam 0.5 MG Tablet  45.00  30 Je Cop   [id number]   Wal ([id number])   0  1.50 LME  Comm Ins   [place]  05/08/2019  1   02/11/2019  Alprazolam 0.5 MG Tablet  45.00  30 Je Cop   [id number]   [facility] ([id number])   2  1.50 LME  Comm Ins   [place]  04/06/2019  1   02/11/2019  Alprazolam 0.5 MG Tablet  45.00  30 Je Cop   [id number]   Wal ([id number])   1  1.50 LME  Comm Ins   [place]  03/13/2019  1   02/11/2019  Alprazolam 0.5 MG Tablet  45.00  30 Je Cop   [id number]   Wal ([id number])   0  1.50 LME       Patient Active Problem List   Diagnosis Date Noted  . Acne 12/09/2015  . Perioral dermatitis 01/19/2015  . Vaginitis and vulvovaginitis 01/19/2015  . Nausea alone 07/13/2014  . Fatigue 07/13/2014  .  Insomnia 05/21/2014  . Elevated IOP 05/18/2014  . Situational insomnia 06/29/2011  . Anxiety and depression 06/29/2011    Past Medical History:  Diagnosis Date  . Depression   . Insomnia   . Kidney stones     Past Surgical History:  Procedure Laterality Date  . AUGMENTATION MAMMAPLASTY    . LITHOTRIPSY  2007-2015   x 3    Social History   Tobacco Use  . Smoking status: Never Smoker  . Smokeless tobacco: Never Used  Substance Use Topics  . Alcohol use: No  . Drug use: No    Family History  Problem Relation Age of Onset  . Kidney disease Father   . Glaucoma Father   . Colon cancer Neg Hx   . Esophageal cancer Neg Hx   . Rectal cancer Neg Hx   . Stomach cancer Neg Hx     Allergies  Allergen Reactions  . Aripiprazole     Headache, nausea, unable to sleep    Medication list has been reviewed and updated.  Current Outpatient Medications on File Prior to Visit  Medication Sig Dispense Refill  . ALPRAZolam (XANAX) 0.5 MG tablet TAKE 1 OR 1 AND 1/2 AT BEDTIME AS NEEDED FOR SLEEP 45 tablet 0  . FLUoxetine (PROZAC) 20 MG tablet Take 1 tablet (20 mg total) by mouth daily. After a week increase to 2 tablets daily 60 tablet 5  . SIMBRINZA  1-0.2 % SUSP Place 1 drop into both eyes 3 (three) times daily.     No current facility-administered medications on file prior to visit.    Review of Systems:  As per HPI- otherwise negative.   Physical Examination: There were no vitals filed for this visit. There were no vitals filed for this visit. There is no height or weight on file to calculate BMI. Ideal Body Weight:    Pt observed via video monitor today- she looks well, her normal self  No cough, wheezing, distress is noted She is not checking vital signs at home  Assessment and Plan: Adjustment insomnia - Plan: ALPRAZolam (XANAX) 0.5 MG tablet  Adjustment reaction with anxiety and depression - Plan: ALPRAZolam (XANAX) 0.5 MG tablet  Polycythemia - Plan: CBC  Screening for diabetes mellitus - Plan: Comprehensive metabolic panel, Hemoglobin A1c  Screening for thyroid disorder - Plan: TSH  Screening for cholesterol level - Plan: Lipid panel   Virtual follow-up visit today.  Overall patient is doing okay, she is using alprazolam for sleep nightly.  I will refill this for her today Encouraged flu shot Ordered routine labs for her to have done at her convenience  Signed Abbe Amsterdam, MD

## 2019-10-12 ENCOUNTER — Other Ambulatory Visit: Payer: Self-pay

## 2019-10-12 ENCOUNTER — Encounter: Payer: Self-pay | Admitting: Family Medicine

## 2019-10-12 ENCOUNTER — Ambulatory Visit (INDEPENDENT_AMBULATORY_CARE_PROVIDER_SITE_OTHER): Payer: BC Managed Care – PPO | Admitting: Family Medicine

## 2019-10-12 DIAGNOSIS — Z13220 Encounter for screening for lipoid disorders: Secondary | ICD-10-CM

## 2019-10-12 DIAGNOSIS — F5102 Adjustment insomnia: Secondary | ICD-10-CM | POA: Diagnosis not present

## 2019-10-12 DIAGNOSIS — Z131 Encounter for screening for diabetes mellitus: Secondary | ICD-10-CM | POA: Diagnosis not present

## 2019-10-12 DIAGNOSIS — Z1329 Encounter for screening for other suspected endocrine disorder: Secondary | ICD-10-CM

## 2019-10-12 DIAGNOSIS — F4323 Adjustment disorder with mixed anxiety and depressed mood: Secondary | ICD-10-CM | POA: Diagnosis not present

## 2019-10-12 DIAGNOSIS — D751 Secondary polycythemia: Secondary | ICD-10-CM

## 2019-10-12 MED ORDER — ALPRAZOLAM 0.5 MG PO TABS: ORAL_TABLET | ORAL | 3 refills | Status: DC

## 2019-11-08 IMAGING — MG DIGITAL SCREENING BILATERAL MAMMOGRAM WITH IMPLANTS, CAD AND TOM
8 of 12 series · 8 of 28 positions shown · non-contrast
Comparison: Previous exam(s).

CLINICAL DATA: Screening.

EXAM:
DIGITAL SCREENING BILATERAL MAMMOGRAM WITH IMPLANTS, CAD AND TOMO
The patient has retropectoral implants. Standard and implant
displaced views were performed.

[R MLO]
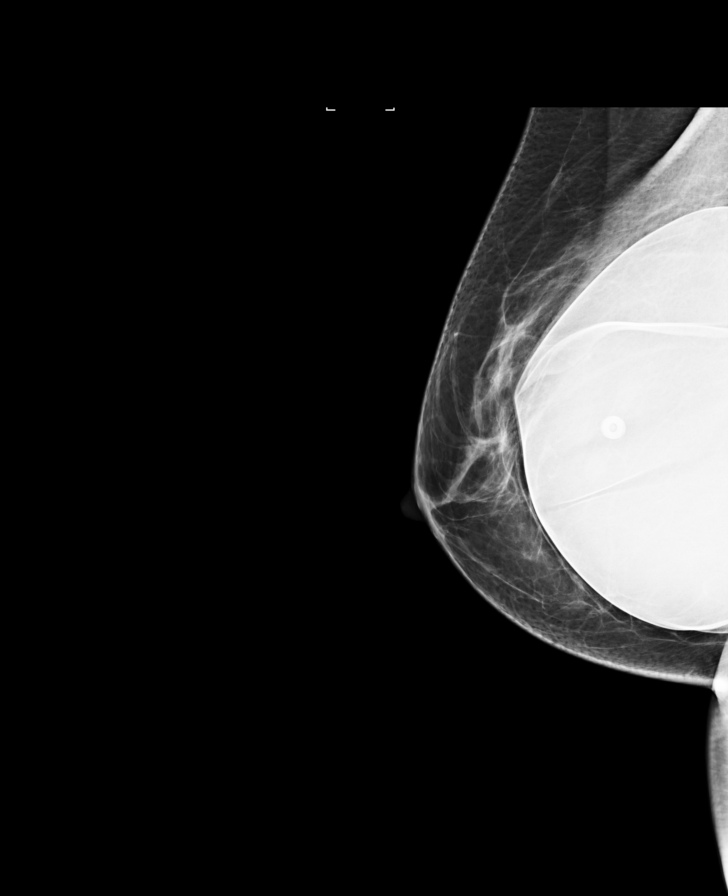

[L MLO]
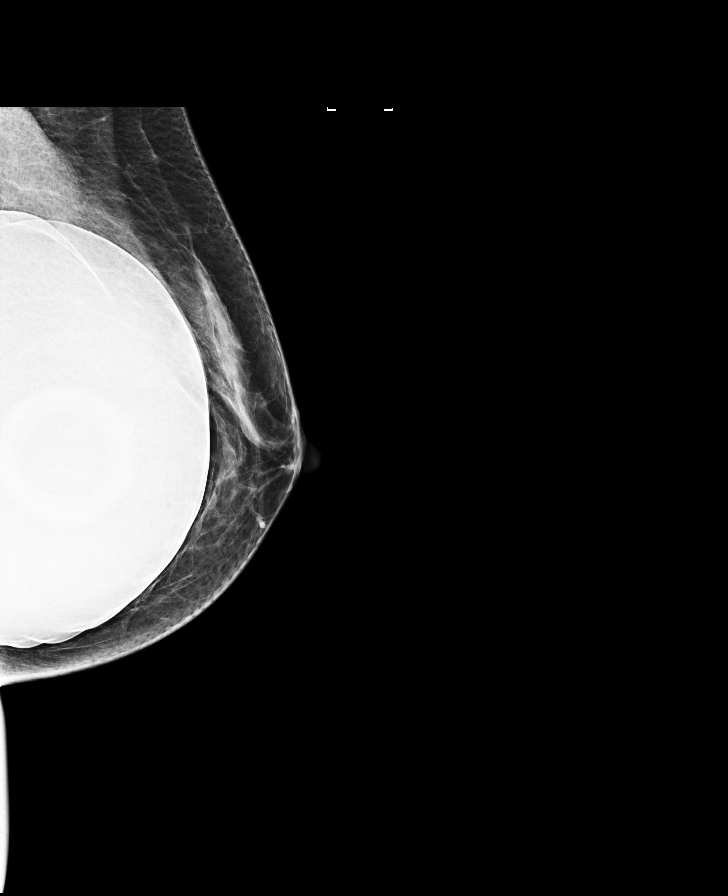

[R CC]
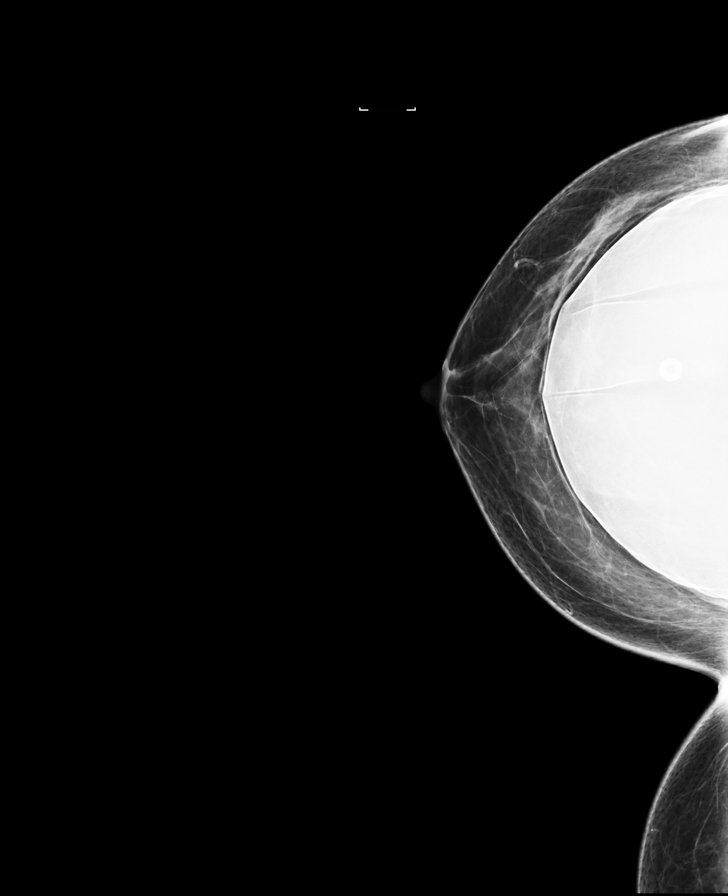

[L CC]
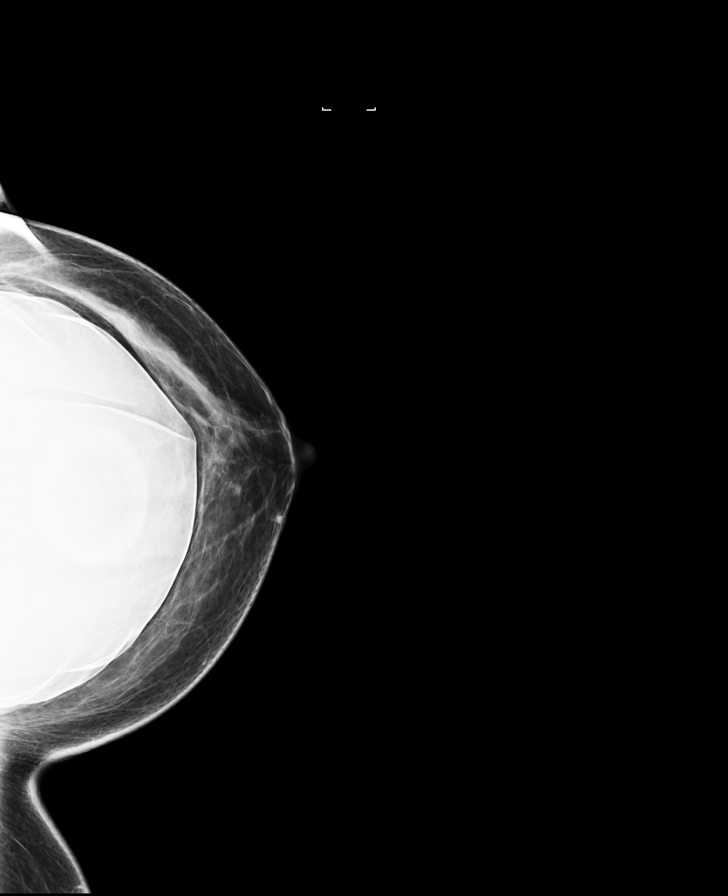

[R MLO synth-2D]
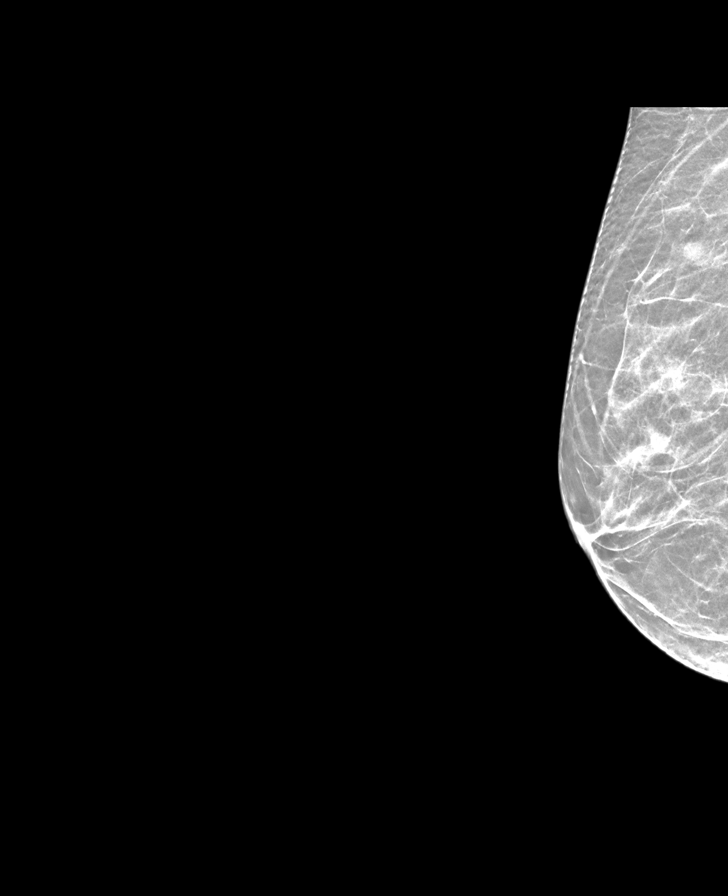

[L MLO synth-2D]
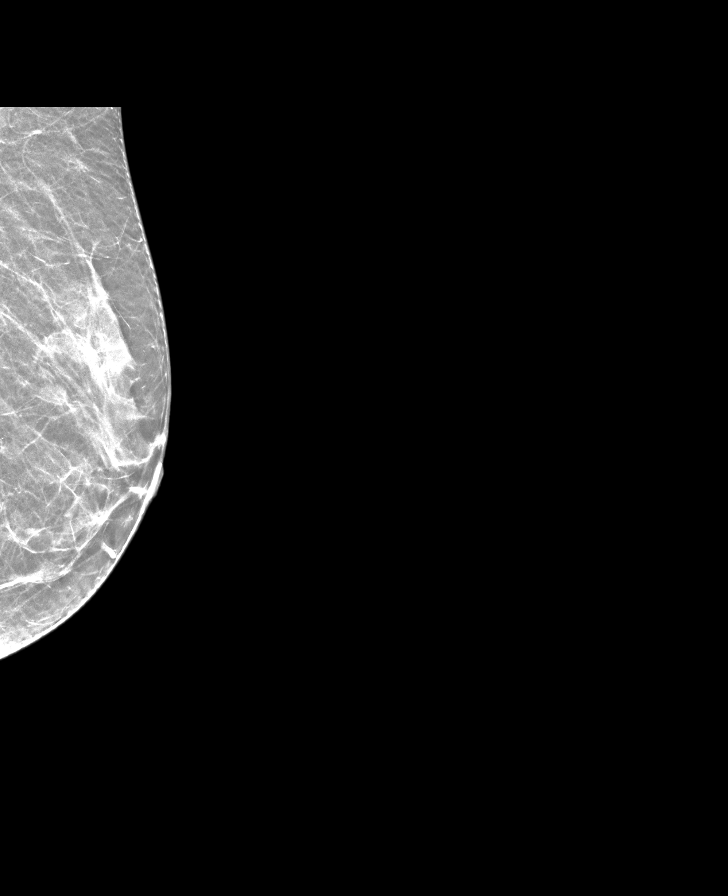

[L CC synth-2D]
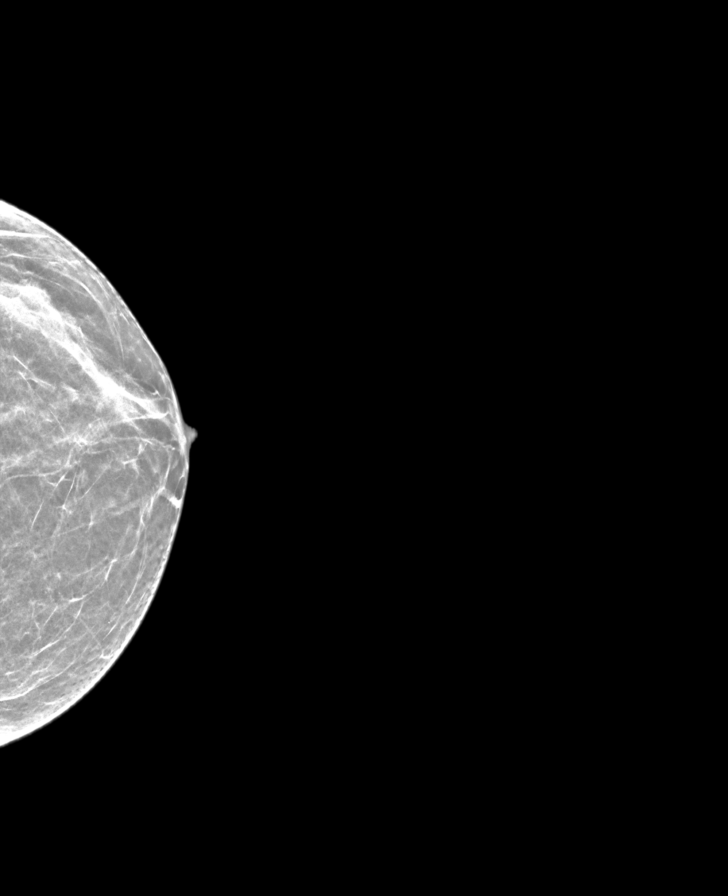

[R CC synth-2D]
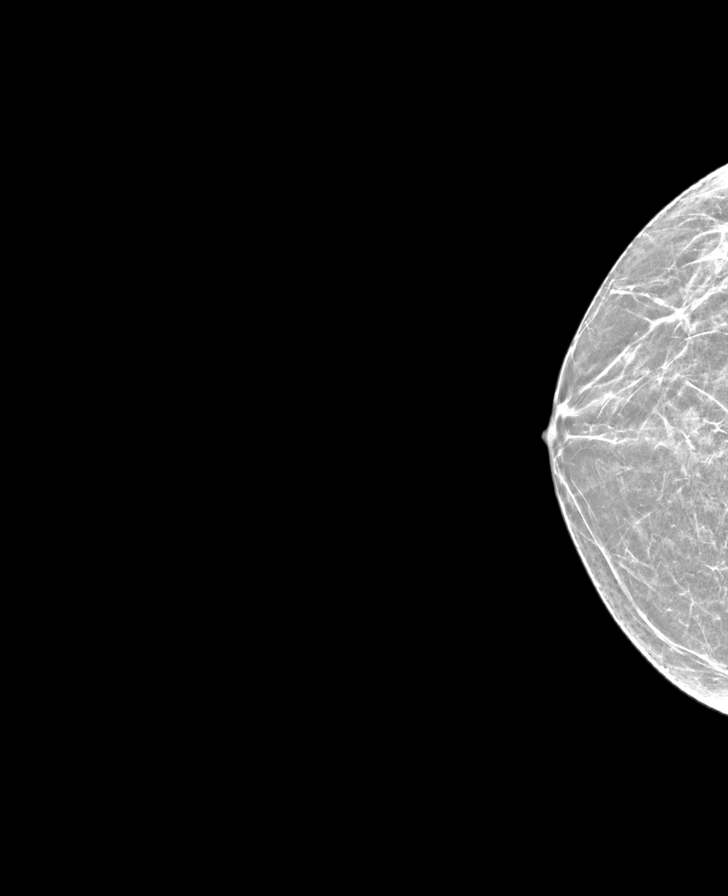

[8 of 28 positions shown; findings below may reference images not displayed]

ACR Breast Density Category b: There are scattered areas of
fibroglandular density.
FINDINGS: There are no findings suspicious for malignancy. Images were
processed with CAD.
IMPRESSION: No mammographic evidence of malignancy. A result letter of this
screening mammogram will be mailed directly to the patient.

RECOMMENDATION:
Screening mammogram in one year. (Code:60-T-8Z4)

BI-RADS CATEGORY  1:  Negative.

## 2019-11-11 DIAGNOSIS — H25813 Combined forms of age-related cataract, bilateral: Secondary | ICD-10-CM | POA: Diagnosis not present

## 2019-11-11 DIAGNOSIS — H401131 Primary open-angle glaucoma, bilateral, mild stage: Secondary | ICD-10-CM | POA: Diagnosis not present

## 2019-11-11 DIAGNOSIS — H04123 Dry eye syndrome of bilateral lacrimal glands: Secondary | ICD-10-CM | POA: Diagnosis not present

## 2020-03-03 DIAGNOSIS — H31092 Other chorioretinal scars, left eye: Secondary | ICD-10-CM | POA: Diagnosis not present

## 2020-03-03 DIAGNOSIS — H401131 Primary open-angle glaucoma, bilateral, mild stage: Secondary | ICD-10-CM | POA: Diagnosis not present

## 2020-03-03 DIAGNOSIS — H25813 Combined forms of age-related cataract, bilateral: Secondary | ICD-10-CM | POA: Diagnosis not present

## 2020-03-03 DIAGNOSIS — H04123 Dry eye syndrome of bilateral lacrimal glands: Secondary | ICD-10-CM | POA: Diagnosis not present

## 2020-03-10 ENCOUNTER — Encounter: Payer: Self-pay | Admitting: Family Medicine

## 2020-03-10 DIAGNOSIS — F4323 Adjustment disorder with mixed anxiety and depressed mood: Secondary | ICD-10-CM

## 2020-03-10 DIAGNOSIS — F5102 Adjustment insomnia: Secondary | ICD-10-CM

## 2020-03-11 MED ORDER — ALPRAZOLAM 0.5 MG PO TABS: ORAL_TABLET | ORAL | 0 refills | Status: DC

## 2020-04-02 NOTE — Progress Notes (Signed)
[place] at [facility] [address] [place], [place] [address] [phone]  Date:  04/04/2020   Name:  Cindy Kelly   DOB:  1966-03-13   MRN:  [id number]  PCP:  Darreld Mclean, MD    Chief Complaint: No chief complaint on file.   History of Present Illness:  Cindy Kelly is a 54 y.o. very pleasant female patient who presents with the following:  Virtual visit today for medication monitoring  Patient location is home, provider location is office.  Patient identity confirmed with 2 factors, she gives consent for virtual visit today.  The patient and myself are present on the call today  Patient with history of insomnia, anxiety Last seen by myself in December 2020 for another virtual visit At that time she was not taking any SSRI, but was using Xanax at bedtime nightly for sleep. She has continued to use alprazolam and had a good results  She is working from home for the last 15 months She has gained about 10 lbs over this time which is distressing for her She has tried increased exercise and change her diet but is not having any luck shedding pounds She has an IUD- she does not menstruate  COVID-19 series; encouraged her to get these done ASAP  03/11/2020  1   03/11/2020  Alprazolam 0.5 MG Tablet  45.00  30 Je Cop   [id number]   Wal ([id number])   0  1.50 LME  Comm Ins   [place]  02/05/2020  1   10/12/2019  Alprazolam 0.5 MG Tablet  45.00  30 Je Cop   [id number]   Wal ([id number])   2  1.50 LME  Comm Ins   [place]  01/03/2020  1   10/12/2019  Alprazolam 0.5 MG Tablet  45.00  30 Je Cop   [id number]   Wal ([id number])   1  1.50 LME  Comm Ins   [place]  12/09/2019  1   10/12/2019  Alprazolam 0.5 MG Tablet  45.00  30 Je Cop   [id number]   Wal ([id number])   0  1.50 LME  Comm Ins   [place]  11/06/2019  1   10/12/2019  Alprazolam 0.5 MG Tablet  45.00  30 Je Cop   [id number]   Wal ([id number])   0  1.50 LME  Other   [place]  10/09/2019  1   09/12/2019  Alprazolam 0.5 MG Tablet  45.00   30 Je Cop   [id number]   Wal ([id number])   0  1.50 LME  Comm Ins   [place]  09/12/2019  1   09/12/2019  Alprazolam 0.5 MG Tablet  45.00  30 Je Cop   [id number]   Wal ([id number])   0  1.50 LME  Comm Ins   [place]  08/06/2019  1   06/11/2019  Alprazolam 0.5 MG Tablet  45.00  30 Je Cop   [id number]   Wal ([id number])   2  1.50 LME  Comm Ins   [place]  07/10/2019  1   06/11/2019  Alprazolam 0.5 MG Tablet  45.00  30 Je Cop   [id number]   Wal ([id number])   1  1.50 LME  Comm Ins   [place]  06/11/2019  1   06/11/2019  Alprazolam 0.5 MG Tablet  45.00  30 Je Cop   [id number]   Wal ([id number])   0  1.50 LME  Patient Active Problem List   Diagnosis Date Noted  . Acne 12/09/2015  . Perioral dermatitis 01/19/2015  . Vaginitis and vulvovaginitis 01/19/2015  . Nausea alone 07/13/2014  . Elevated IOP 05/18/2014  . Situational insomnia 06/29/2011  . Anxiety and depression 06/29/2011    Past Medical History:  Diagnosis Date  . Depression   . Insomnia   . Kidney stones     Past Surgical History:  Procedure Laterality Date  . AUGMENTATION MAMMAPLASTY    . LITHOTRIPSY  2007-2015   x 3    Social History   Tobacco Use  . Smoking status: Never Smoker  . Smokeless tobacco: Never Used  Substance Use Topics  . Alcohol use: No  . Drug use: No    Family History  Problem Relation Age of Onset  . Kidney disease Father   . Glaucoma Father   . Colon cancer Neg Hx   . Esophageal cancer Neg Hx   . Rectal cancer Neg Hx   . Stomach cancer Neg Hx     Allergies  Allergen Reactions  . Aripiprazole     Headache, nausea, unable to sleep    Medication list has been reviewed and updated.  Current Outpatient Medications on File Prior to Visit  Medication Sig Dispense Refill  . ALPRAZolam (XANAX) 0.5 MG tablet TAKE 1 OR 1 AND 1/2 AT BEDTIME AS NEEDED FOR SLEEP 45 tablet 0  . FLUoxetine (PROZAC) 20 MG tablet Take 1 tablet (20 mg total) by mouth daily. After a week increase to 2 tablets daily 60 tablet 5  . SIMBRINZA 1-0.2 % SUSP Place 1 drop into both eyes 3  (three) times daily.     No current facility-administered medications on file prior to visit.    Review of Systems:  As per HPI- otherwise negative.   Physical Examination: There were no vitals filed for this visit. There were no vitals filed for this visit. There is no height or weight on file to calculate BMI. Ideal Body Weight:    Pt observed via video monitor-she looks well, normal self She is not checking vital signs at home No distress noted  Assessment and Plan: Screening for deficiency anemia - Plan: CBC  Adjustment insomnia - Plan: ALPRAZolam (XANAX) 0.5 MG tablet  Adjustment reaction with anxiety and depression - Plan: ALPRAZolam (XANAX) 0.5 MG tablet  Screening for diabetes mellitus - Plan: Comprehensive metabolic panel, Hemoglobin A1c  Screening for thyroid disorder - Plan: TSH  Weight gain - Plan: TSH  Amenorrhea - Plan: FSH  Screening for hyperlipidemia - Plan: Lipid panel   Virtual visit today to follow-up on medication and discuss concern of weight gain Video used for entirety of visit today Patient is doing well on her current dose of alprazolam at bedtime, I refilled this for her This medication is working well for her, she feels that she continues to need it first  Overdue for routine labs Patient has noticed about 10 pounds of weight gain over the last year.  This can be due to change in her routine not she is working from home, also consider hypothyroidism or menopause.  She has an IUD so we are not sure about menstruation.  I have ordered labs for her, she will come in for a lab visit at her earliest convenience We discussed hormone replacement therapy, if she is interested in doing HRT I would refer her to her gynecologist  Will plan further follow- up pending labs.   Signed Abbe Amsterdam, MD

## 2020-04-04 ENCOUNTER — Telehealth (INDEPENDENT_AMBULATORY_CARE_PROVIDER_SITE_OTHER): Payer: BC Managed Care – PPO | Admitting: Family Medicine

## 2020-04-04 ENCOUNTER — Other Ambulatory Visit: Payer: Self-pay

## 2020-04-04 ENCOUNTER — Encounter: Payer: Self-pay | Admitting: Family Medicine

## 2020-04-04 DIAGNOSIS — Z13220 Encounter for screening for lipoid disorders: Secondary | ICD-10-CM

## 2020-04-04 DIAGNOSIS — N912 Amenorrhea, unspecified: Secondary | ICD-10-CM

## 2020-04-04 DIAGNOSIS — F4323 Adjustment disorder with mixed anxiety and depressed mood: Secondary | ICD-10-CM

## 2020-04-04 DIAGNOSIS — Z131 Encounter for screening for diabetes mellitus: Secondary | ICD-10-CM | POA: Diagnosis not present

## 2020-04-04 DIAGNOSIS — Z130 Encounter for screening for diseases of the blood and blood-forming organs and certain disorders involving the immune mechanism: Secondary | ICD-10-CM | POA: Diagnosis not present

## 2020-04-04 DIAGNOSIS — F5102 Adjustment insomnia: Secondary | ICD-10-CM

## 2020-04-04 DIAGNOSIS — Z1329 Encounter for screening for other suspected endocrine disorder: Secondary | ICD-10-CM

## 2020-04-04 DIAGNOSIS — R635 Abnormal weight gain: Secondary | ICD-10-CM

## 2020-04-04 MED ORDER — ALPRAZOLAM 0.5 MG PO TABS: ORAL_TABLET | ORAL | 3 refills | Status: DC

## 2020-04-05 ENCOUNTER — Encounter: Payer: Self-pay | Admitting: Family Medicine

## 2020-04-06 NOTE — Telephone Encounter (Signed)
Can you see if patient owes anything? Not exactly sure where to look! Thanks Candise Bowens!

## 2020-04-11 NOTE — Progress Notes (Deleted)
[facility] at [facility] [address], [address] [place], [place] [address] [phone]  Date:  04/14/2020   Name:  Cindy Kelly   DOB:  [birth year]   MRN:  [id number]  PCP:  Pearline Cables, MD    Chief Complaint: No chief complaint on file.   History of Present Illness:  Cindy Kelly is a 54 y.o. very pleasant female patient who presents with the following:  Here today to discuss recent visit  Last seen by myself virtually earlier this month with concern of weight gain I ordered labs for her but not done yet - pt thought this was a lab only visit today and does not wish to be seen Her lab orders are in   Patient Active Problem List   Diagnosis Date Noted  . Acne 12/09/2015  . Perioral dermatitis 01/19/2015  . Vaginitis and vulvovaginitis 01/19/2015  . Nausea alone 07/13/2014  . Elevated IOP 05/18/2014  . Situational insomnia 06/29/2011  . Anxiety and depression 06/29/2011    Past Medical History:  Diagnosis Date  . Depression   . Insomnia   . Kidney stones     Past Surgical History:  Procedure Laterality Date  . AUGMENTATION MAMMAPLASTY    . LITHOTRIPSY  2007-2015   x 3    Social History   Tobacco Use  . Smoking status: Never Smoker  . Smokeless tobacco: Never Used  Substance Use Topics  . Alcohol use: No  . Drug use: No    Family History  Problem Relation Age of Onset  . Kidney disease Father   . Glaucoma Father   . Colon cancer Neg Hx   . Esophageal cancer Neg Hx   . Rectal cancer Neg Hx   . Stomach cancer Neg Hx     Allergies  Allergen Reactions  . Aripiprazole     Headache, nausea, unable to sleep    Medication list has been reviewed and updated.  Current Outpatient Medications on File Prior to Visit  Medication Sig Dispense Refill  . ALPRAZolam (XANAX) 0.5 MG tablet TAKE 1 OR 1 AND 1/2 AT BEDTIME AS NEEDED FOR SLEEP 45 tablet 3  . FLUoxetine (PROZAC) 20 MG tablet Take  1 tablet (20 mg total) by mouth daily. After a week increase to 2 tablets daily 60 tablet 5  . SIMBRINZA 1-0.2 % SUSP Place 1 drop into both eyes 3 (three) times daily.     No current facility-administered medications on file prior to visit.    Review of Systems:  As per HPI- otherwise negative.   Physical Examination: There were no vitals filed for this visit. There were no vitals filed for this visit. There is no height or weight on file to calculate BMI. Ideal Body Weight:    Not seen    Assessment and Plan: Weight gain  Lab visit only today- no charge    Signed Abbe Amsterdam, MD

## 2020-04-14 ENCOUNTER — Other Ambulatory Visit: Payer: Self-pay

## 2020-04-14 ENCOUNTER — Other Ambulatory Visit (INDEPENDENT_AMBULATORY_CARE_PROVIDER_SITE_OTHER): Payer: BC Managed Care – PPO

## 2020-04-14 ENCOUNTER — Ambulatory Visit (INDEPENDENT_AMBULATORY_CARE_PROVIDER_SITE_OTHER): Payer: BC Managed Care – PPO | Admitting: Family Medicine

## 2020-04-14 DIAGNOSIS — Z538 Procedure and treatment not carried out for other reasons: Secondary | ICD-10-CM

## 2020-04-14 DIAGNOSIS — R635 Abnormal weight gain: Secondary | ICD-10-CM

## 2020-04-14 DIAGNOSIS — Z1329 Encounter for screening for other suspected endocrine disorder: Secondary | ICD-10-CM

## 2020-04-14 DIAGNOSIS — N912 Amenorrhea, unspecified: Secondary | ICD-10-CM

## 2020-04-14 DIAGNOSIS — Z131 Encounter for screening for diabetes mellitus: Secondary | ICD-10-CM | POA: Diagnosis not present

## 2020-04-14 DIAGNOSIS — Z13220 Encounter for screening for lipoid disorders: Secondary | ICD-10-CM

## 2020-04-14 DIAGNOSIS — Z130 Encounter for screening for diseases of the blood and blood-forming organs and certain disorders involving the immune mechanism: Secondary | ICD-10-CM | POA: Diagnosis not present

## 2020-04-14 LAB — LIPID PANEL
Cholesterol: 173 mg/dL (ref 0–200)
HDL: 57.3 mg/dL (ref >39.00)
LDL Cholesterol: 104 mg/dL — ABNORMAL HIGH (ref 0–99)
NonHDL: 116.04
Total CHOL/HDL Ratio: 3
Triglycerides: 60 mg/dL (ref 0.0–149.0)
VLDL: 12 mg/dL (ref 0.0–40.0)

## 2020-04-14 LAB — COMPREHENSIVE METABOLIC PANEL
ALT: 29 U/L (ref 0–35)
AST: 18 U/L (ref 0–37)
Albumin: 4.5 g/dL (ref 3.5–5.2)
Alkaline Phosphatase: 123 U/L — ABNORMAL HIGH (ref 39–117)
BUN: 15 mg/dL (ref 6–23)
CO2: 32 mEq/L (ref 19–32)
Calcium: 10 mg/dL (ref 8.4–10.5)
Chloride: 102 mEq/L (ref 96–112)
Creatinine, Ser: 0.83 mg/dL (ref 0.40–1.20)
GFR: 72.21 mL/min (ref >60.00)
Glucose, Bld: 89 mg/dL (ref 70–99)
Potassium: 4.8 mEq/L (ref 3.5–5.1)
Sodium: 140 mEq/L (ref 135–145)
Total Bilirubin: 0.6 mg/dL (ref 0.2–1.2)
Total Protein: 7.4 g/dL (ref 6.0–8.3)

## 2020-04-14 LAB — HEMOGLOBIN A1C: Hgb A1c MFr Bld: 5.3 % (ref 4.6–6.5)

## 2020-04-14 LAB — CBC
HCT: 47.5 % — ABNORMAL HIGH (ref 36.0–46.0)
Hemoglobin: 15.8 g/dL — ABNORMAL HIGH (ref 12.0–15.0)
MCHC: 33.2 g/dL (ref 30.0–36.0)
MCV: 91.1 fl (ref 78.0–100.0)
Platelets: 292 10*3/uL (ref 150.0–400.0)
RBC: 5.21 Mil/uL — ABNORMAL HIGH (ref 3.87–5.11)
RDW: 13.8 % (ref 11.5–15.5)
WBC: 6.1 10*3/uL (ref 4.0–10.5)

## 2020-04-14 LAB — FOLLICLE STIMULATING HORMONE: FSH: 140.6 m[IU]/mL

## 2020-04-14 LAB — TSH: TSH: 2.23 u[IU]/mL (ref 0.35–4.50)

## 2020-04-15 ENCOUNTER — Other Ambulatory Visit (INDEPENDENT_AMBULATORY_CARE_PROVIDER_SITE_OTHER): Payer: BC Managed Care – PPO

## 2020-04-15 ENCOUNTER — Encounter: Payer: Self-pay | Admitting: Family Medicine

## 2020-04-15 ENCOUNTER — Telehealth: Payer: Self-pay | Admitting: *Deleted

## 2020-04-15 DIAGNOSIS — D582 Other hemoglobinopathies: Secondary | ICD-10-CM | POA: Diagnosis not present

## 2020-04-15 LAB — IRON, TOTAL/TOTAL IRON BINDING CAP
%SAT: 32 % (calc) (ref 16–45)
Iron: 126 ug/dL (ref 45–160)
TIBC: 394 mcg/dL (calc) (ref 250–450)

## 2020-04-15 LAB — FERRITIN: Ferritin: 74.4 ng/mL (ref 10.0–291.0)

## 2020-04-15 NOTE — Telephone Encounter (Signed)
Add on request has been faxed to [facility] lab. Future order has been placed for hemochromatosis panel and request sent to [facility] to send both lavender tubes from 6/17 for testing. I will let you know if they are unable to add these tests.

## 2020-04-15 NOTE — Telephone Encounter (Signed)
-----   Message from Pearline Cables, MD sent at 04/15/2020  5:39 AM EDT ----- Hi Cindy Kelly-is it possible to add on a ferritin/iron panel for this patient?  Eating more of a long shot, can we add on hemochromatosis mutation panel?  For polycythemia  Thanks so much! JC

## 2020-04-15 NOTE — Telephone Encounter (Signed)
You are the best- thank you!

## 2020-04-25 LAB — HEMOCHROMATOSIS DNA-PCR(C282Y,H63D)

## 2020-04-26 ENCOUNTER — Encounter: Payer: Self-pay | Admitting: Family Medicine

## 2020-05-06 DIAGNOSIS — Z975 Presence of (intrauterine) contraceptive device: Secondary | ICD-10-CM | POA: Diagnosis not present

## 2020-05-06 DIAGNOSIS — R232 Flushing: Secondary | ICD-10-CM | POA: Diagnosis not present

## 2020-05-06 DIAGNOSIS — R635 Abnormal weight gain: Secondary | ICD-10-CM | POA: Diagnosis not present

## 2020-05-06 DIAGNOSIS — R7989 Other specified abnormal findings of blood chemistry: Secondary | ICD-10-CM | POA: Diagnosis not present

## 2020-05-18 DIAGNOSIS — R635 Abnormal weight gain: Secondary | ICD-10-CM | POA: Diagnosis not present

## 2020-05-19 DIAGNOSIS — R635 Abnormal weight gain: Secondary | ICD-10-CM | POA: Diagnosis not present

## 2020-07-11 DIAGNOSIS — H401131 Primary open-angle glaucoma, bilateral, mild stage: Secondary | ICD-10-CM | POA: Diagnosis not present

## 2020-08-18 DIAGNOSIS — R635 Abnormal weight gain: Secondary | ICD-10-CM | POA: Diagnosis not present

## 2020-08-24 ENCOUNTER — Encounter: Payer: Self-pay | Admitting: Family Medicine

## 2020-08-24 DIAGNOSIS — F5102 Adjustment insomnia: Secondary | ICD-10-CM

## 2020-08-24 DIAGNOSIS — F4323 Adjustment disorder with mixed anxiety and depressed mood: Secondary | ICD-10-CM

## 2020-08-25 MED ORDER — ALPRAZOLAM 0.5 MG PO TABS: ORAL_TABLET | ORAL | 3 refills | Status: DC

## 2020-09-01 ENCOUNTER — Other Ambulatory Visit: Payer: Self-pay | Admitting: Family Medicine

## 2020-09-01 DIAGNOSIS — Z1231 Encounter for screening mammogram for malignant neoplasm of breast: Secondary | ICD-10-CM

## 2020-10-10 DIAGNOSIS — H04123 Dry eye syndrome of bilateral lacrimal glands: Secondary | ICD-10-CM | POA: Diagnosis not present

## 2020-10-10 DIAGNOSIS — H401131 Primary open-angle glaucoma, bilateral, mild stage: Secondary | ICD-10-CM | POA: Diagnosis not present

## 2020-10-10 DIAGNOSIS — H31092 Other chorioretinal scars, left eye: Secondary | ICD-10-CM | POA: Diagnosis not present

## 2020-10-10 DIAGNOSIS — H25813 Combined forms of age-related cataract, bilateral: Secondary | ICD-10-CM | POA: Diagnosis not present

## 2020-10-12 ENCOUNTER — Ambulatory Visit
Admission: RE | Admit: 2020-10-12 | Discharge: 2020-10-12 | Disposition: A | Discharge status: Home / Self Care | Payer: BC Managed Care – PPO | Source: Ambulatory Visit | Attending: Family Medicine | Admitting: Family Medicine

## 2020-10-12 ENCOUNTER — Other Ambulatory Visit: Payer: Self-pay

## 2020-10-12 DIAGNOSIS — Z1231 Encounter for screening mammogram for malignant neoplasm of breast: Secondary | ICD-10-CM

## 2020-11-23 DIAGNOSIS — R635 Abnormal weight gain: Secondary | ICD-10-CM | POA: Diagnosis not present

## 2020-11-23 DIAGNOSIS — H401131 Primary open-angle glaucoma, bilateral, mild stage: Secondary | ICD-10-CM | POA: Diagnosis not present

## 2020-11-28 IMAGING — US US BREAST*R* LIMITED INC AXILLA
1 series · 13 of 14 positions shown · non-contrast
Comparison: Previous exam(s).

CLINICAL DATA: Screening recall for possible masses in each breast.

EXAM:
ULTRASOUND OF THE BILATERAL BREAST

[Series 1: us breast*right* limited inc axilla · 0.07mm/px · 13 of 14 slices shown]
[im 1/14]
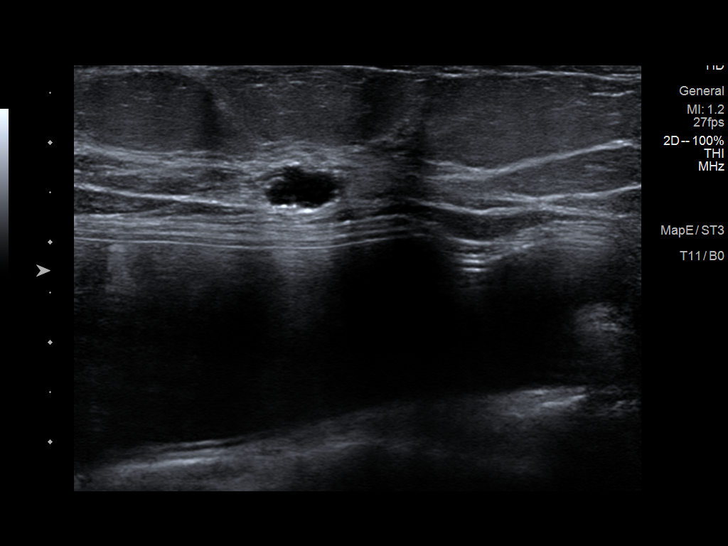
[im 2/14]
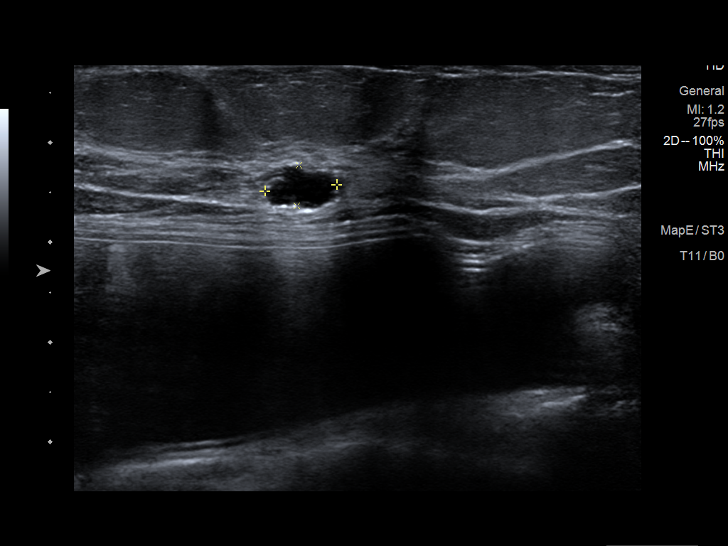
[im 3/14]
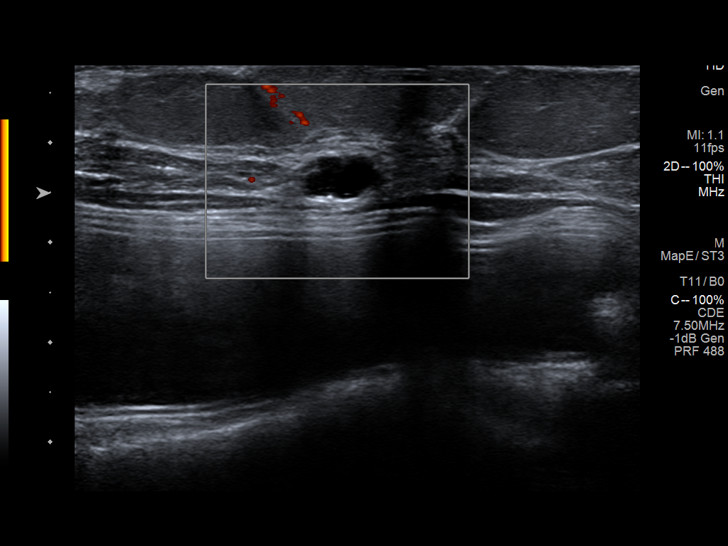
[im 4/14]
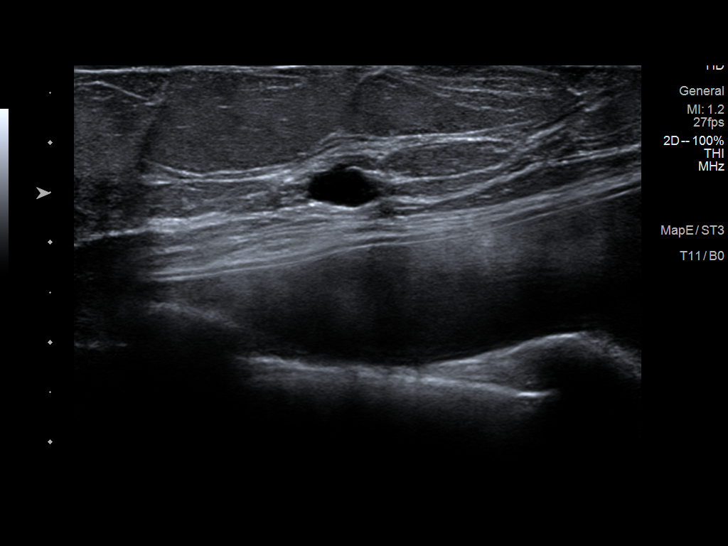
[im 5/14]
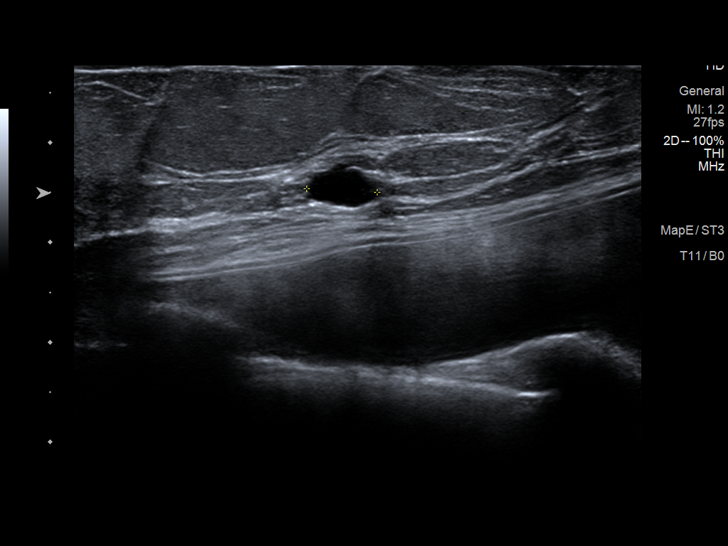
[im 6/14]
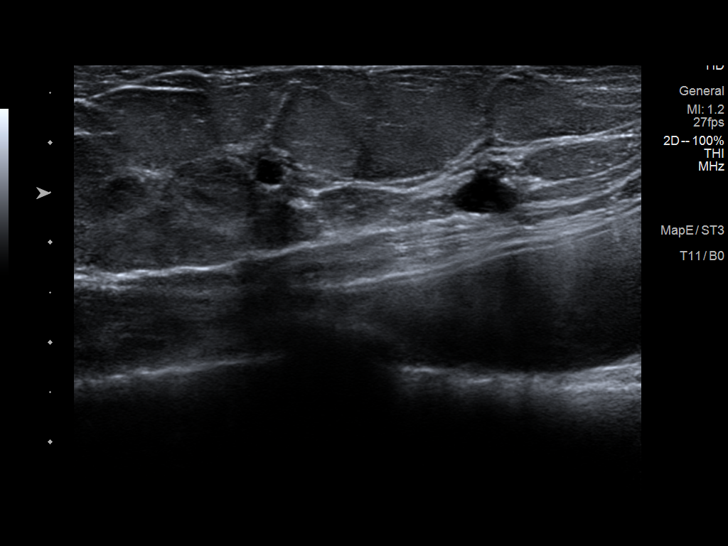
[im 8/14]
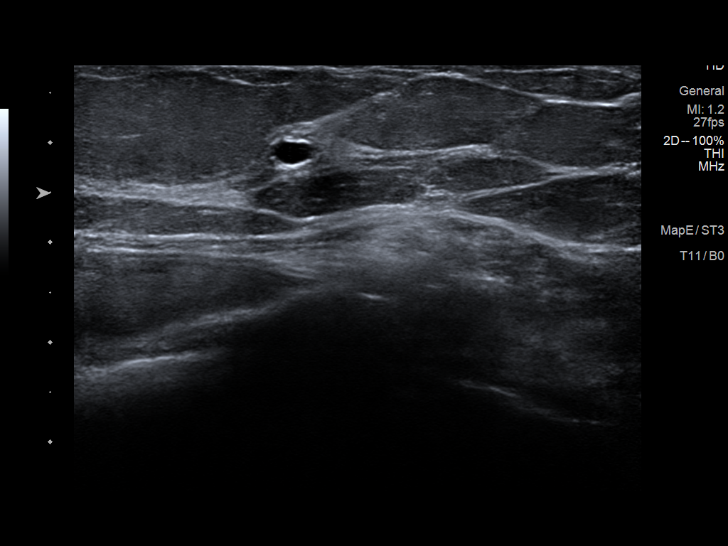
[im 9/14]
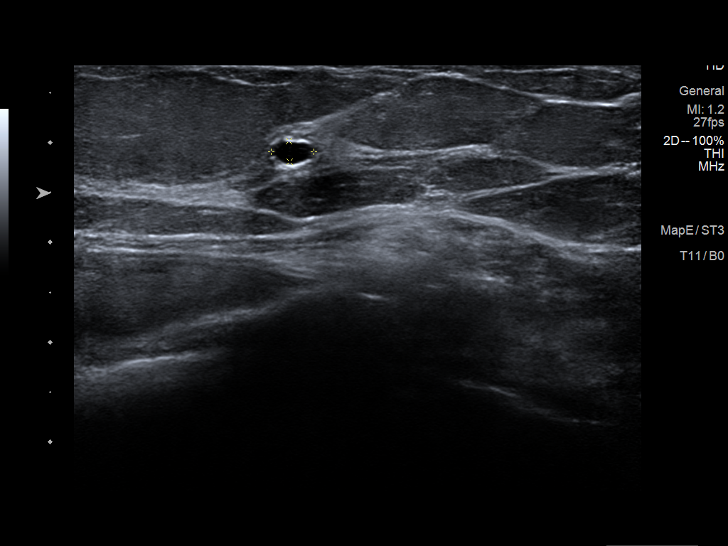
[im 10/14]
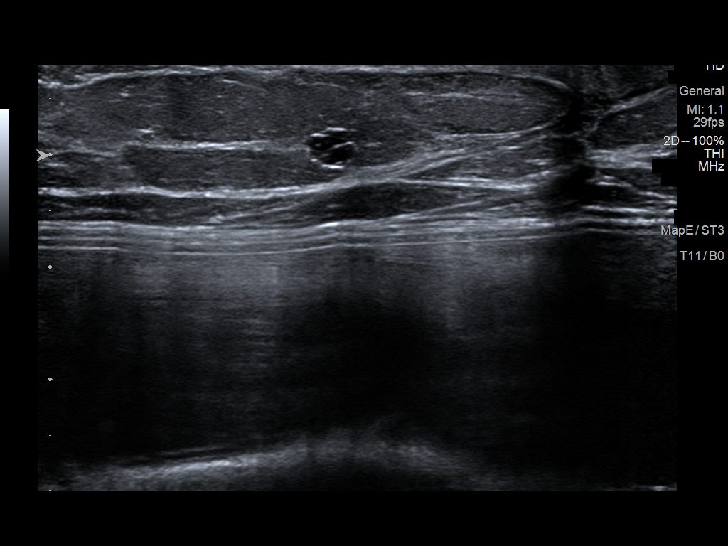
[im 11/14]
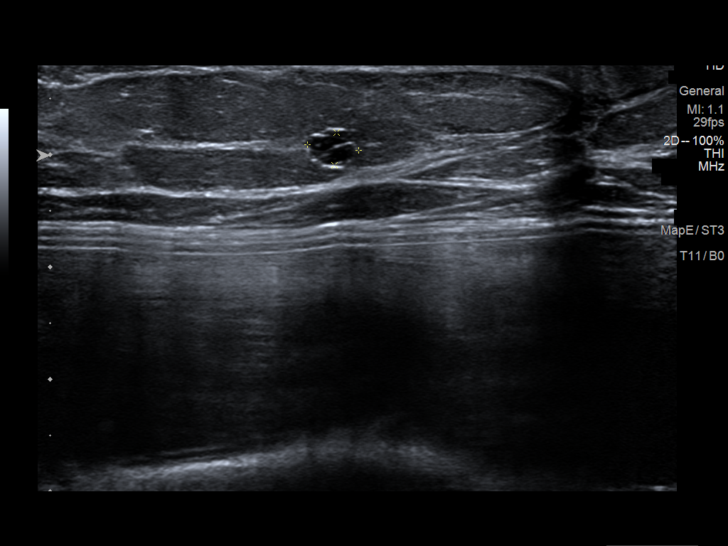
[im 12/14]
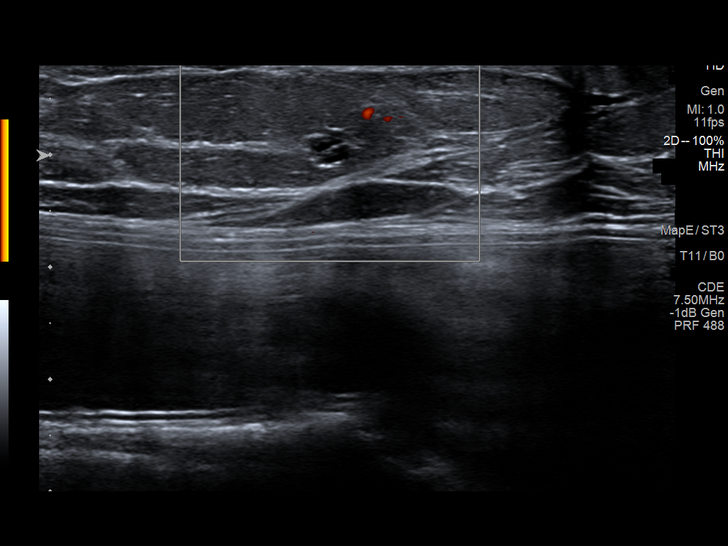
[im 13/14]
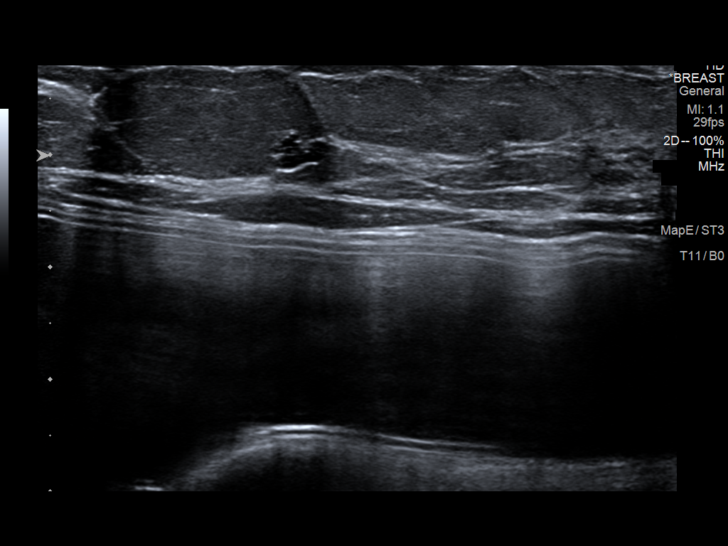
[im 14/14]
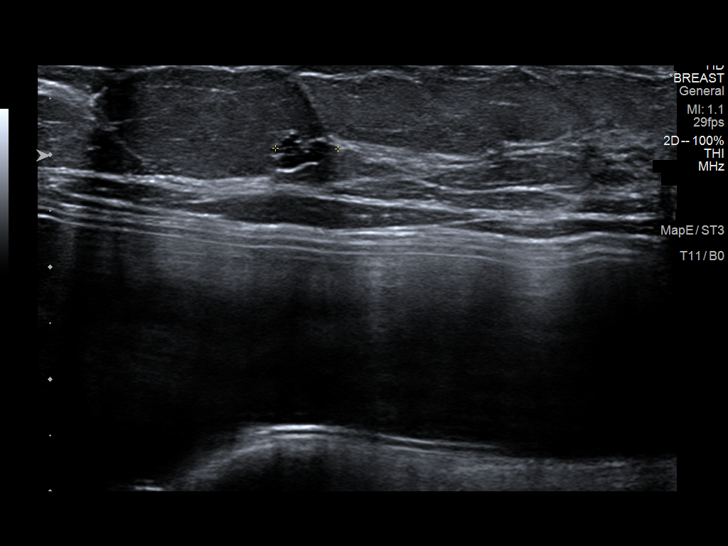

[13 of 14 positions shown; findings below may reference images not displayed]

FINDINGS: Targeted right breast ultrasound is performed, showing several
cysts. At 12 o'clock, 2 cm the nipple, there is a cyst measuring 7 x
4 x 7 mm. At 12 o'clock, 3 cm the nipple, there is a small oval cyst
measuring 4 x 2 x 3 mm. At 10 o'clock, 2 cm the nipple, there is a
septated cysts consistent with an apocrine cyst measuring 6 x 3 x 5
mm. There are no solid masses or suspicious lesions.

Targeted left breast ultrasound is performed, showing an oval simple
cyst at 3 o'clock, 4 cm from the nipple, posteriorly near the
implant, measuring 9 x 6 x 7 mm. There is a smaller cyst at 3
o'clock, 4 cm the nipple, near the larger cyst, that measures 4 x 2
x 6 mm. No solid masses or suspicious lesions.
IMPRESSION: 1. No evidence of breast malignancy.
2. Benign bilateral breast cysts that account for the mammographic
masses.

RECOMMENDATION:
Screening mammogram in one year.(Code:[id number])

I have discussed the findings and recommendations with the patient.
If applicable, a reminder letter will be sent to the patient
regarding the next appointment.

BI-RADS CATEGORY  2: Benign.

## 2020-12-27 ENCOUNTER — Other Ambulatory Visit: Payer: Self-pay | Admitting: Family Medicine

## 2020-12-27 DIAGNOSIS — F4323 Adjustment disorder with mixed anxiety and depressed mood: Secondary | ICD-10-CM

## 2020-12-27 DIAGNOSIS — F5102 Adjustment insomnia: Secondary | ICD-10-CM

## 2020-12-28 ENCOUNTER — Encounter: Payer: Self-pay | Admitting: Family Medicine

## 2021-01-07 ENCOUNTER — Encounter: Payer: Self-pay | Admitting: Family Medicine

## 2021-01-08 MED ORDER — FLUOXETINE HCL 40 MG PO CAPS: 40.0000 mg | ORAL_CAPSULE | Freq: Every day | ORAL | 0 refills | Status: DC

## 2021-01-08 NOTE — Addendum Note (Signed)
Addended by: Abbe Amsterdam C on: 01/08/2021 02:09 PM   Modules accepted: Orders

## 2021-01-09 MED ORDER — FLUOXETINE HCL 20 MG PO TABS: 40.0000 mg | ORAL_TABLET | Freq: Every day | ORAL | 3 refills | Status: DC

## 2021-01-09 NOTE — Addendum Note (Signed)
Addended by: Abbe Amsterdam C on: 01/09/2021 08:24 AM   Modules accepted: Orders

## 2021-01-16 ENCOUNTER — Encounter: Payer: Self-pay | Admitting: Family Medicine

## 2021-01-22 NOTE — Progress Notes (Signed)
[facility] at [facility] [address] [place], [place] [address] [phone]  Date:  01/26/2021   Name:  Cindy Kelly   DOB:  1966/04/29   MRN:  [id number]  PCP:  Pearline Cables, MD    Chief Complaint: No chief complaint on file.   History of Present Illness:  Cindy Kelly is a 55 y.o. very pleasant female patient who presents with the following:  Periodic follow-up visit today Virtual visit today- pt is at home, I am at office Patient identity confirmed with 2 factors, she gives consent for virtual visit today.  The patient myself are present on the call  Last seen by myself in June Generally in good health IUD for contraception  She has recently been seeing endocrinology for weight gain -this seems to be simply physiologic Treated with fluoxetine for anxiety/ depression   Her mood is better with the prozac 40 mg -She has been working from home for the last 2 years and has thrived with this routine.  She has been a bit more stressed as her office was thinking about having people come back in person.  However, her manager has noted that she is performing very well at home and she should be able to continue to stay at home most of the time Her anxiety has been under good control with fluoxetine and alprazolam  No other concerns today except she has not yet been able to fill her prescription for fluoxetine tablets.  She wants to take 40 mg, but does much better with tablets   Patient Active Problem List   Diagnosis Date Noted  . Acne 12/09/2015  . Perioral dermatitis 01/19/2015  . Vaginitis and vulvovaginitis 01/19/2015  . Nausea alone 07/13/2014  . Elevated IOP 05/18/2014  . Situational insomnia 06/29/2011  . Anxiety and depression 06/29/2011    Past Medical History:  Diagnosis Date  . Depression   . Insomnia   . Kidney stones     Past Surgical History:  Procedure Laterality Date  .  AUGMENTATION MAMMAPLASTY    . LITHOTRIPSY  2007-2015   x 3    Social History   Tobacco Use  . Smoking status: Never Smoker  . Smokeless tobacco: Never Used  Substance Use Topics  . Alcohol use: No  . Drug use: No    Family History  Problem Relation Age of Onset  . Kidney disease Father   . Glaucoma Father   . Colon cancer Neg Hx   . Esophageal cancer Neg Hx   . Rectal cancer Neg Hx   . Stomach cancer Neg Hx     Allergies  Allergen Reactions  . Aripiprazole     Headache, nausea, unable to sleep    Medication list has been reviewed and updated.  Current Outpatient Medications on File Prior to Visit  Medication Sig Dispense Refill  . ALPRAZolam (XANAX) 0.5 MG tablet TAKE 1 TO 1 AND 1/2 TABLETS BY MOUTH AT BEDTIME AS NEEDED FOR SLEEP 45 tablet 0  . FLUoxetine (PROZAC) 20 MG tablet Take 2 tablets (40 mg total) by mouth daily. 180 tablet 3  . SIMBRINZA 1-0.2 % SUSP Place 1 drop into both eyes 3 (three) times daily.     No current facility-administered medications on file prior to visit.    Review of Systems:  As per HPI- otherwise negative.   Physical Examination: There were no vitals filed for this visit. There were no  vitals filed for this visit. There is no height or weight on file to calculate BMI. Ideal Body Weight:    Patient observed her video monitor-looks well  Assessment and Plan: Adjustment reaction with anxiety and depression  Following up today for medication monitoring.  Reviewed medications with patient, I called [facility] and sent to please fill fluoxetine 20 mg tablets.  They have been called back because apparently is not covered by her insurance.  I will discuss the plan with the patient  Signed Abbe Amsterdam, MD

## 2021-01-26 ENCOUNTER — Telehealth (INDEPENDENT_AMBULATORY_CARE_PROVIDER_SITE_OTHER): Payer: BC Managed Care – PPO | Admitting: Family Medicine

## 2021-01-26 ENCOUNTER — Encounter: Payer: Self-pay | Admitting: Family Medicine

## 2021-01-26 ENCOUNTER — Other Ambulatory Visit: Payer: Self-pay

## 2021-01-26 ENCOUNTER — Telehealth: Payer: Self-pay | Admitting: *Deleted

## 2021-01-26 ENCOUNTER — Ambulatory Visit: Payer: BC Managed Care – PPO | Admitting: Family Medicine

## 2021-01-26 DIAGNOSIS — F4323 Adjustment disorder with mixed anxiety and depressed mood: Secondary | ICD-10-CM | POA: Diagnosis not present

## 2021-01-26 NOTE — Telephone Encounter (Signed)
Cindy Kelly from [facility] called and stated that fluoxetine tablets were not covered by insurance and is over $600.  I asked if they have it in a liquid and she stated that they do.  It comes in 20mg /35ml.  Also I investigated [facility] on the savings list and they have fluoxetine tablets but the 10mg  on there.  $4 for 30 day supply and $10 for 90 days

## 2021-01-27 MED ORDER — FLUOXETINE HCL 10 MG PO TABS: 40.0000 mg | ORAL_TABLET | Freq: Every day | ORAL | 11 refills | Status: DC

## 2021-02-13 ENCOUNTER — Encounter: Payer: Self-pay | Admitting: Family Medicine

## 2021-02-13 ENCOUNTER — Other Ambulatory Visit: Payer: Self-pay | Admitting: Family Medicine

## 2021-02-13 DIAGNOSIS — F4323 Adjustment disorder with mixed anxiety and depressed mood: Secondary | ICD-10-CM

## 2021-02-13 DIAGNOSIS — F5102 Adjustment insomnia: Secondary | ICD-10-CM

## 2021-02-14 MED ORDER — ALPRAZOLAM 0.5 MG PO TABS: ORAL_TABLET | ORAL | 1 refills | Status: DC

## 2021-03-23 DIAGNOSIS — H25813 Combined forms of age-related cataract, bilateral: Secondary | ICD-10-CM | POA: Diagnosis not present

## 2021-03-23 DIAGNOSIS — H04123 Dry eye syndrome of bilateral lacrimal glands: Secondary | ICD-10-CM | POA: Diagnosis not present

## 2021-03-23 DIAGNOSIS — H31092 Other chorioretinal scars, left eye: Secondary | ICD-10-CM | POA: Diagnosis not present

## 2021-03-23 DIAGNOSIS — H401131 Primary open-angle glaucoma, bilateral, mild stage: Secondary | ICD-10-CM | POA: Diagnosis not present

## 2021-04-14 DIAGNOSIS — H401131 Primary open-angle glaucoma, bilateral, mild stage: Secondary | ICD-10-CM | POA: Diagnosis not present

## 2021-04-14 DIAGNOSIS — H04123 Dry eye syndrome of bilateral lacrimal glands: Secondary | ICD-10-CM | POA: Diagnosis not present

## 2021-04-30 ENCOUNTER — Other Ambulatory Visit: Payer: Self-pay | Admitting: Family Medicine

## 2021-04-30 DIAGNOSIS — F5102 Adjustment insomnia: Secondary | ICD-10-CM

## 2021-04-30 DIAGNOSIS — F4323 Adjustment disorder with mixed anxiety and depressed mood: Secondary | ICD-10-CM

## 2021-05-02 NOTE — Telephone Encounter (Signed)
Requesting: alprazolam Contract:  UDS: Last Visit: 01/26/21 Next Visit: none Last Refill: 02/14/21  Please Advise

## 2021-05-04 DIAGNOSIS — H401131 Primary open-angle glaucoma, bilateral, mild stage: Secondary | ICD-10-CM | POA: Diagnosis not present

## 2021-05-04 DIAGNOSIS — H16222 Keratoconjunctivitis sicca, not specified as Sjogren's, left eye: Secondary | ICD-10-CM | POA: Diagnosis not present

## 2021-05-04 DIAGNOSIS — H04123 Dry eye syndrome of bilateral lacrimal glands: Secondary | ICD-10-CM | POA: Diagnosis not present

## 2021-06-29 DIAGNOSIS — R635 Abnormal weight gain: Secondary | ICD-10-CM | POA: Diagnosis not present

## 2021-06-30 ENCOUNTER — Encounter: Payer: Self-pay | Admitting: Family Medicine

## 2021-06-30 DIAGNOSIS — R7401 Elevation of levels of liver transaminase levels: Secondary | ICD-10-CM

## 2021-06-30 NOTE — Addendum Note (Signed)
Addended by: Abbe Amsterdam C on: 06/30/2021 04:31 PM   Modules accepted: Orders

## 2021-07-01 MED ORDER — ONDANSETRON 4 MG PO TBDP: 4.0000 mg | ORAL_TABLET | Freq: Three times a day (TID) | ORAL | 0 refills | Status: DC | PRN

## 2021-07-01 NOTE — Addendum Note (Signed)
Addended by: Abbe Amsterdam C on: 07/01/2021 08:36 PM   Modules accepted: Orders

## 2021-07-04 NOTE — Progress Notes (Addendum)
[facility] [address], [address] [place], [place] [address] [phone]  Date:  07/06/2021   Name:  Cindy Kelly   DOB:  01/15/1966   MRN:  [id number]  PCP:  Pearline Cables, MD    Chief Complaint: Discuss Lab Results   History of Present Illness:  Cindy Kelly is a 55 y.o. very pleasant female patient who presents with the following:  Patient is seen today to follow-up on recently noted elevated bilirubin and transaminitis -we last checked her LFTs about 1 year ago, they were normal at that time and had been normal previously  She contacted me about one week ago concerned-she had been to see endocrinology with [facility] to discuss weight loss.  They did lab work and noted that her LFTs were significantly elevated- labs below 06/29/21.  They have referred her to see hepatology and she has an appointment next month.  However, she was worried and wanted to be seen sooner which is certainly understandable. No significant Tylenol or alcohol use Today patient relates that the week prior to her blood work she had eaten out and seemed to get sick with nausea and belly pain for several days.  Her husband ate the same food but did not get sick.  During that time she noted that her urine seemed dark and she felt itchy.  The symptoms have now improved   Total Bilirubin 0.1 - 1.2 MG/DL 2.4 High   Patients taking eltrombopag at doses >/= 100 mg daily may show falsely elevated values of 10% or greater.  Alkaline Phosphatase 25 - 125 IU/L or U/L 315 High     AST (SGOT) 5 - 40 IU/L or U/L 294 High     ALT (SGPT) 5 - 50 IU/L or U/L 569 High      She had a RUQ [place] earlier today which we discussed.  Liver and gallbladder normal, she does have kidney stones noted incidentally US Abdomen Limited RUQ (LIVER/GB)  Result Date: 07/06/2021 CLINICAL DATA:  55 year old female with history of elevated liver function tests. EXAM:  ULTRASOUND ABDOMEN LIMITED RIGHT UPPER QUADRANT COMPARISON:  No priors. FINDINGS: Gallbladder: No gallstones or wall thickening visualized. No sonographic Murphy sign noted by sonographer. Common bile duct: Diameter: 3.3 mm Liver: No focal lesion identified. Within normal limits in parenchymal echogenicity. Portal vein is patent on color Doppler imaging with normal direction of blood flow towards the liver. Other: Incidental imaging of the right kidney demonstrates the presence of multiple echogenic shadowing foci, compatible with multiple kidney stones measuring up to 1.9 cm. IMPRESSION: 1. No findings to account for the patient's elevated liver function tests. 2. Multiple nonobstructive calculi in the right renal collecting system. Electronically Signed   By: Trudie Reed M.D.   On: 07/06/2021 10:51     She also needs to have her FMLA updated- she has not used this in a few year since she worked for [employer] [place].  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  She would like to restart her FMLA.  She uses intermittently for anxiety, depression and insomnia.  She anticipates she might need to use her leave up to twice per month Patient Active Problem List   Diagnosis Date Noted   Acne 12/09/2015   Perioral dermatitis 01/19/2015   Vaginitis and vulvovaginitis 01/19/2015   Nausea alone 07/13/2014   Elevated IOP 05/18/2014  Situational insomnia 06/29/2011   Anxiety and depression 06/29/2011    Past Medical History:  Diagnosis Date   Depression    Insomnia    Kidney stones     Past Surgical History:  Procedure Laterality Date   AUGMENTATION MAMMAPLASTY     LITHOTRIPSY  2007-2015   x 3    Social History   Tobacco Use   Smoking status: Never   Smokeless tobacco: Never  Substance Use Topics   Alcohol use: No   Drug use: No    Family History  Problem Relation Age of Onset   Kidney disease Father    Glaucoma  Father    Colon cancer Neg Hx    Esophageal cancer Neg Hx    Rectal cancer Neg Hx    Stomach cancer Neg Hx     Allergies  Allergen Reactions   Aripiprazole     Headache, nausea, unable to sleep    Medication list has been reviewed and updated.  Current Outpatient Medications on File Prior to Visit  Medication Sig Dispense Refill   FLUoxetine (PROZAC) 10 MG tablet Take 4 tablets (40 mg total) by mouth daily. 120 tablet 11   ondansetron (ZOFRAN ODT) 4 MG disintegrating tablet Take 1 tablet (4 mg total) by mouth every 8 (eight) hours as needed for nausea or vomiting. 20 tablet 0   SIMBRINZA 1-0.2 % SUSP Place 1 drop into both eyes 3 (three) times daily.     No current facility-administered medications on file prior to visit.    Review of Systems:  As per HPI- otherwise negative.  Physical Examination: Vitals:   07/06/21 1051  BP: 116/74  Pulse: 89  Resp: 17  Temp: 97.8 F (36.6 C)  SpO2: 97%   Vitals:   07/06/21 1051  Weight: 149 lb (67.6 kg)  Height: 5\' 2"  (1.575 m)   Body mass index is 27.25 kg/m. Ideal Body Weight: Weight in (lb) to have BMI = 25: 136.4  GEN: no acute distress.  Overweight, looks well HEENT: Atraumatic, Normocephalic.  No jaundice or scleral icterus is noted Ears and Nose: No external deformity. CV: RRR, No M/G/R. No JVD. No thrill. No extra heart sounds. PULM: CTA B, no wheezes, crackles, rhonchi. No retractions. No resp. distress. No accessory muscle use. ABD: S, NT, ND, +BS. No rebound. No HSM.  Belly is benign EXTR: No c/c/e PSYCH: Normally interactive. Conversant.    Assessment and Plan: Transaminitis - Plan: Hepatitis, Acute, Hepatic function panel, Protime-INR  Adjustment insomnia - Plan: ALPRAZolam (XANAX) 0.5 MG tablet  Adjustment reaction with anxiety and depression - Plan: ALPRAZolam (XANAX) 0.5 MG tablet  Patient seen today with recent diagnosis of transaminitis.  Upon review of history, I wonder if she had a viral hepatitis  or other foodborne illness.  We will check a hepatitis panel and follow-up on her LFTs today.  Hopefully her LFTs may have improved-plan neck step pending results Refilled alprazolam I completed FMLA paperwork for her today, will fax to her employer  This visit occurred during the SARS-CoV-2 public health emergency.  Safety protocols were in place, including screening questions prior to the visit, additional usage of staff PPE, and extensive cleaning of exam room while observing appropriate contact time as indicated for disinfecting solutions.   Signed , MD  Received her labs as below, message to patient Hepatitis panel is pending, liver tests overall improved.  Assuming she is doing well plan to recheck LFTs in 2 weeks Results for orders placed  or performed in visit on 07/06/21  Hepatic function panel  Result Value Ref Range   Total Bilirubin 1.2 0.2 - 1.2 mg/dL   Bilirubin, Direct 0.3 0.0 - 0.3 mg/dL   Alkaline Phosphatase 361 (H) 39 - 117 U/L   AST 147 (H) 0 - 37 U/L   ALT 404 (H) 0 - 35 U/L   Total Protein 7.7 6.0 - 8.3 g/dL   Albumin 4.3 3.5 - 5.2 g/dL  Protime-INR  Result Value Ref Range   INR 0.9 0.8 - 1.0 ratio   Prothrombin Time 10.5 9.6 - 13.1 sec

## 2021-07-06 ENCOUNTER — Other Ambulatory Visit: Payer: Self-pay

## 2021-07-06 ENCOUNTER — Encounter: Payer: Self-pay | Admitting: Family Medicine

## 2021-07-06 ENCOUNTER — Ambulatory Visit (HOSPITAL_BASED_OUTPATIENT_CLINIC_OR_DEPARTMENT_OTHER)
Admission: RE | Admit: 2021-07-06 | Discharge: 2021-07-06 | Disposition: A | Discharge status: Home / Self Care | Payer: BC Managed Care – PPO | Source: Ambulatory Visit | Attending: Family Medicine | Admitting: Family Medicine

## 2021-07-06 ENCOUNTER — Ambulatory Visit: Payer: BC Managed Care – PPO | Admitting: Family Medicine

## 2021-07-06 VITALS — BP 116/74 | HR 89 | Temp 97.8°F | Resp 17 | Ht 62.0 in | Wt 149.0 lb

## 2021-07-06 DIAGNOSIS — F5102 Adjustment insomnia: Secondary | ICD-10-CM | POA: Diagnosis not present

## 2021-07-06 DIAGNOSIS — F4323 Adjustment disorder with mixed anxiety and depressed mood: Secondary | ICD-10-CM

## 2021-07-06 DIAGNOSIS — R7401 Elevation of levels of liver transaminase levels: Secondary | ICD-10-CM

## 2021-07-06 DIAGNOSIS — N200 Calculus of kidney: Secondary | ICD-10-CM | POA: Diagnosis not present

## 2021-07-06 DIAGNOSIS — R7989 Other specified abnormal findings of blood chemistry: Secondary | ICD-10-CM | POA: Diagnosis not present

## 2021-07-06 LAB — HEPATIC FUNCTION PANEL
ALT: 404 U/L — ABNORMAL HIGH (ref 0–35)
AST: 147 U/L — ABNORMAL HIGH (ref 0–37)
Albumin: 4.3 g/dL (ref 3.5–5.2)
Alkaline Phosphatase: 361 U/L — ABNORMAL HIGH (ref 39–117)
Bilirubin, Direct: 0.3 mg/dL (ref 0.0–0.3)
Total Bilirubin: 1.2 mg/dL (ref 0.2–1.2)
Total Protein: 7.7 g/dL (ref 6.0–8.3)

## 2021-07-06 LAB — PROTIME-INR
INR: 0.9 ratio (ref 0.8–1.0)
Prothrombin Time: 10.5 s (ref 9.6–13.1)

## 2021-07-06 MED ORDER — ALPRAZOLAM 0.5 MG PO TABS: ORAL_TABLET | ORAL | 1 refills | Status: DC

## 2021-07-06 NOTE — Patient Instructions (Signed)
Good to see you today!  Your liver ultrasound shows good news Please go to lab- I will be in touch with an update asap  We also did your FMLA paperwork today and I refilled your sleeping medication

## 2021-07-06 NOTE — Addendum Note (Signed)
Addended by: Abbe Amsterdam C on: 07/06/2021 03:30 PM   Modules accepted: Orders

## 2021-07-07 LAB — HEPATITIS PANEL, ACUTE
Hep A IgM: NONREACTIVE
Hep B C IgM: NONREACTIVE
Hepatitis B Surface Ag: NONREACTIVE
Hepatitis C Ab: NONREACTIVE
SIGNAL TO CUT-OFF: 0.01 (ref <1.00)

## 2021-07-19 DIAGNOSIS — H11441 Conjunctival cysts, right eye: Secondary | ICD-10-CM | POA: Diagnosis not present

## 2021-07-19 DIAGNOSIS — H1131 Conjunctival hemorrhage, right eye: Secondary | ICD-10-CM | POA: Diagnosis not present

## 2021-07-21 ENCOUNTER — Other Ambulatory Visit: Payer: Self-pay

## 2021-07-21 ENCOUNTER — Other Ambulatory Visit (INDEPENDENT_AMBULATORY_CARE_PROVIDER_SITE_OTHER): Payer: BC Managed Care – PPO

## 2021-07-21 ENCOUNTER — Encounter: Payer: Self-pay | Admitting: Family Medicine

## 2021-07-21 DIAGNOSIS — R7401 Elevation of levels of liver transaminase levels: Secondary | ICD-10-CM | POA: Diagnosis not present

## 2021-07-21 LAB — HEPATIC FUNCTION PANEL
ALT: 38 U/L — ABNORMAL HIGH (ref 0–35)
AST: 23 U/L (ref 0–37)
Albumin: 4.3 g/dL (ref 3.5–5.2)
Alkaline Phosphatase: 163 U/L — ABNORMAL HIGH (ref 39–117)
Bilirubin, Direct: 0.2 mg/dL (ref 0.0–0.3)
Total Bilirubin: 0.8 mg/dL (ref 0.2–1.2)
Total Protein: 7.5 g/dL (ref 6.0–8.3)

## 2021-09-04 ENCOUNTER — Other Ambulatory Visit (HOSPITAL_BASED_OUTPATIENT_CLINIC_OR_DEPARTMENT_OTHER): Payer: Self-pay | Admitting: Family Medicine

## 2021-09-04 DIAGNOSIS — Z1231 Encounter for screening mammogram for malignant neoplasm of breast: Secondary | ICD-10-CM

## 2021-09-11 DIAGNOSIS — R7989 Other specified abnormal findings of blood chemistry: Secondary | ICD-10-CM | POA: Diagnosis not present

## 2021-09-14 ENCOUNTER — Other Ambulatory Visit: Payer: Self-pay | Admitting: Family Medicine

## 2021-09-14 DIAGNOSIS — H401131 Primary open-angle glaucoma, bilateral, mild stage: Secondary | ICD-10-CM | POA: Diagnosis not present

## 2021-09-14 DIAGNOSIS — F5102 Adjustment insomnia: Secondary | ICD-10-CM

## 2021-09-14 DIAGNOSIS — H25813 Combined forms of age-related cataract, bilateral: Secondary | ICD-10-CM | POA: Diagnosis not present

## 2021-09-14 DIAGNOSIS — H11441 Conjunctival cysts, right eye: Secondary | ICD-10-CM | POA: Diagnosis not present

## 2021-09-14 DIAGNOSIS — F4323 Adjustment disorder with mixed anxiety and depressed mood: Secondary | ICD-10-CM

## 2021-09-14 DIAGNOSIS — H1131 Conjunctival hemorrhage, right eye: Secondary | ICD-10-CM | POA: Diagnosis not present

## 2021-10-05 ENCOUNTER — Encounter: Payer: Self-pay | Admitting: Family Medicine

## 2021-10-06 NOTE — Progress Notes (Addendum)
[facility] [address], [address] [place], [place] [address] [phone]  Date:  10/11/2021   Name:  Cindy Kelly   DOB:  1966-08-31   MRN:  [id number]  PCP:  Cindy Cables, MD    Chief Complaint: Nevus (Under the right arm. This one is raised and concerning to the pt. )   History of Present Illness:  Cindy Kelly is a 55 y.o. very pleasant female patient who presents with the following:  Pt seen today with concern of a skin issue  She notes a large possible mole in her armpit area which has concerned her.  She was worried it could be cancer and also tends to itch and rub on her clothing Last seen by myself in September for transaminitis  She was also seen by GI with [facility]- Dr Allena Katz- last month: 1. Elevated LFTs  Patient has improving LFTs with a normal ultrasound.  No new medications, no heavy/daily alcohol, tylenol use.  Patient did have an episode of nausea and abdominal pain so potentially had some mild gastroenteritis or other infection causing transient elevations in LFTs as they are improving.  Low likelihood for choledocholithiasis as no gallstones or sludge noted.  Orders Placed This Encounter  Procedures   Hepatic Function Panel   Gamma GT   CBC and Differential  PLAN 1.) Repeat HFP today with GGT given elevated ALP 2.) If normalized then no further work up required.  3.) If still elevated with proceed with full liver work up to rule out other causes of chronic liver disease 4.) Colonoscopy for screening due 2029.   Her liver tests had returned to normal!  Patient Active Problem List   Diagnosis Date Noted   Acne 12/09/2015   Perioral dermatitis 01/19/2015   Vaginitis and vulvovaginitis 01/19/2015   Nausea alone 07/13/2014   Elevated IOP 05/18/2014   Situational insomnia 06/29/2011   Anxiety and depression 06/29/2011    Past Medical History:  Diagnosis Date   Depression     Insomnia    Kidney stones     Past Surgical History:  Procedure Laterality Date   AUGMENTATION MAMMAPLASTY     LITHOTRIPSY  2007-2015   x 3    Social History   Tobacco Use   Smoking status: Never   Smokeless tobacco: Never  Substance Use Topics   Alcohol use: No   Drug use: No    Family History  Problem Relation Age of Onset   Kidney disease Father    Glaucoma Father    Colon cancer Neg Hx    Esophageal cancer Neg Hx    Rectal cancer Neg Hx    Stomach cancer Neg Hx     Allergies  Allergen Reactions   Aripiprazole     Headache, nausea, unable to sleep    Medication list has been reviewed and updated.  Current Outpatient Medications on File Prior to Visit  Medication Sig Dispense Refill   ALPRAZolam (XANAX) 0.5 MG tablet TAKE 1 TO 1 AND 1/2 TABLETS BY MOUTH AT BEDTIME AS NEEDED FOR SLEEP 45 tablet 3   FLUoxetine (PROZAC) 10 MG tablet Take 4 tablets (40 mg total) by mouth daily. 120 tablet 11   SIMBRINZA 1-0.2 % SUSP Place 1 drop into both eyes 3 (three) times daily.     No current facility-administered medications on file prior to visit.    Review of Systems:  As per HPI- otherwise negative.  Physical Examination: Vitals:   10/11/21 1601  BP: 110/60  Pulse: 73  Resp: 18  SpO2: 99%   Vitals:   10/11/21 1601  Weight: 145 lb (65.8 kg)   Body mass index is 26.52 kg/m. Ideal Body Weight:    GEN: no acute distress.  Minimal overweight looks well  HEENT: Atraumatic, Normocephalic.  Ears and Nose: No external deformity. CV: RRR, No M/G/R. No JVD. No thrill. No extra heart sounds. PULM: CTA B, no wheezes, crackles, rhonchi. No retractions. No resp. distress. No accessory muscle use. ABD: S, NT, ND. No rebound. No HSM. EXTR: No c/c/e PSYCH: Normally interactive. Conversant.  There is an apparent seborrheic keratosis, darkly pigmented in the right axilla.  Is approximately 4 mm in diameter Pap collected today, normal exam  Verbal consent obtained.   Cryotherapy with liquid nitrogen done to the above-mentioned seborrheic keratosis x4 cycles.  Patient tolerated well with no immediate complications  Assessment and Plan: Seborrheic keratosis  Screening for cervical cancer - Plan: Cytology - PAP  Patient seen today with a seborrheic keratosis -treated with cryotherapy as above.  This lesion is pigmented, I advised her if it does not come off within a months to let me know.  In that case we can either retreat with cryotherapy or refer to dermatology  Pap screening also performed, we will be in touch with results  Signed Abbe Amsterdam, MD

## 2021-10-09 ENCOUNTER — Inpatient Hospital Stay (HOSPITAL_BASED_OUTPATIENT_CLINIC_OR_DEPARTMENT_OTHER): Admission: RE | Admit: 2021-10-09 | Payer: BC Managed Care – PPO | Source: Ambulatory Visit

## 2021-10-11 ENCOUNTER — Ambulatory Visit (INDEPENDENT_AMBULATORY_CARE_PROVIDER_SITE_OTHER): Payer: BC Managed Care – PPO | Admitting: Family Medicine

## 2021-10-11 ENCOUNTER — Other Ambulatory Visit (HOSPITAL_COMMUNITY)
Admission: RE | Admit: 2021-10-11 | Discharge: 2021-10-11 | Disposition: A | Discharge status: Home / Self Care | Payer: BC Managed Care – PPO | Source: Ambulatory Visit | Attending: Family Medicine | Admitting: Family Medicine

## 2021-10-11 VITALS — BP 110/60 | HR 73 | Resp 18 | Wt 145.0 lb

## 2021-10-11 DIAGNOSIS — L821 Other seborrheic keratosis: Secondary | ICD-10-CM | POA: Diagnosis not present

## 2021-10-11 DIAGNOSIS — Z124 Encounter for screening for malignant neoplasm of cervix: Secondary | ICD-10-CM | POA: Diagnosis not present

## 2021-10-11 NOTE — Patient Instructions (Signed)
Good to see you today- we did your pap today.  I will be in touch with this report We froze the skin lesion under your right arm today- a seborrheic keratosis.  These are common and harmless skin lesions.  However, if it does not peel off in the next month or so let me know.  We can freeze it again or refer you to dermatology in that case

## 2021-10-13 ENCOUNTER — Encounter: Payer: Self-pay | Admitting: Family Medicine

## 2021-10-13 LAB — CYTOLOGY - PAP
Comment: NEGATIVE
Diagnosis: NEGATIVE
High risk HPV: NEGATIVE

## 2021-11-03 DIAGNOSIS — E669 Obesity, unspecified: Secondary | ICD-10-CM | POA: Diagnosis not present

## 2021-11-13 ENCOUNTER — Encounter (HOSPITAL_BASED_OUTPATIENT_CLINIC_OR_DEPARTMENT_OTHER): Payer: Self-pay

## 2021-11-13 ENCOUNTER — Other Ambulatory Visit: Payer: Self-pay

## 2021-11-13 ENCOUNTER — Ambulatory Visit (HOSPITAL_BASED_OUTPATIENT_CLINIC_OR_DEPARTMENT_OTHER)
Admission: RE | Admit: 2021-11-13 | Discharge: 2021-11-13 | Disposition: A | Discharge status: Home / Self Care | Payer: BC Managed Care – PPO | Source: Ambulatory Visit | Attending: Family Medicine | Admitting: Family Medicine

## 2021-11-13 DIAGNOSIS — Z1231 Encounter for screening mammogram for malignant neoplasm of breast: Secondary | ICD-10-CM | POA: Diagnosis not present

## 2022-01-28 ENCOUNTER — Other Ambulatory Visit: Payer: Self-pay | Admitting: Family Medicine

## 2022-01-28 DIAGNOSIS — F5102 Adjustment insomnia: Secondary | ICD-10-CM

## 2022-01-28 DIAGNOSIS — F4323 Adjustment disorder with mixed anxiety and depressed mood: Secondary | ICD-10-CM

## 2022-01-29 DIAGNOSIS — M76822 Posterior tibial tendinitis, left leg: Secondary | ICD-10-CM | POA: Diagnosis not present

## 2022-02-08 DIAGNOSIS — H401131 Primary open-angle glaucoma, bilateral, mild stage: Secondary | ICD-10-CM | POA: Diagnosis not present

## 2022-03-02 DIAGNOSIS — M76822 Posterior tibial tendinitis, left leg: Secondary | ICD-10-CM | POA: Diagnosis not present

## 2022-03-02 DIAGNOSIS — Q6689 Other  specified congenital deformities of feet: Secondary | ICD-10-CM | POA: Diagnosis not present

## 2022-04-07 DIAGNOSIS — G44209 Tension-type headache, unspecified, not intractable: Secondary | ICD-10-CM | POA: Diagnosis not present

## 2022-04-07 DIAGNOSIS — J029 Acute pharyngitis, unspecified: Secondary | ICD-10-CM | POA: Diagnosis not present

## 2022-04-07 DIAGNOSIS — J069 Acute upper respiratory infection, unspecified: Secondary | ICD-10-CM | POA: Diagnosis not present

## 2022-05-16 ENCOUNTER — Encounter: Payer: Self-pay | Admitting: Family Medicine

## 2022-05-17 NOTE — Patient Instructions (Addendum)
It was good to see you again today, I will work on getting your forms completed and sent in ASAP We can have you start on phentermine 15 mg daily for weight loss  Best of luck with your surgery   Please let me know how your BP does with adding phentermine

## 2022-05-17 NOTE — Progress Notes (Signed)
[facility] [address], [address] [place], [place] [address] [phone]  Date:  05/21/2022   Name:  Cindy Kelly   DOB:  Feb 06, 1966   MRN:  [id number]  PCP:  Pearline Cables, MD    Chief Complaint: Pre-op Exam [facility] Implant exchange in October. /Concerns/ questions: menopause might be causing weight gain.)   History of Present Illness:  Cindy Kelly is a 56 y.o. very pleasant female patient who presents with the following:  Patient is seen today for preoperative evaluation She plans to have her breast implants changed, I have been sent preop forms for completion Most recent visit with myself was in December  She needs and EKG and also some labs for her surgery- [phone]  Wt Readings from Last 3 Encounters:  05/21/22 152 lb (68.9 kg)  10/11/21 145 lb (65.8 kg)  07/06/21 149 lb (67.6 kg)   She has notes some weight gain the last few years-  She has never really been more than abut 125 lbs  until the last few years We talked about her diet, it sounds as though her diet is already quite good  Her exercise is limited due to an issue with her left ankle -if she does excessive walking she will have pain and swelling the next day No CP or SOB however with exercise; for example, she is able to vacuum her home and climb 2 flights of stairs without any distress [facility]- Dr Sherrine Maples  I called their office, unfortunately she does have glaucoma so phentermine is not a good option     Patient Active Problem List   Diagnosis Date Noted   Acne 12/09/2015   Perioral dermatitis 01/19/2015   Vaginitis and vulvovaginitis 01/19/2015   Nausea alone 07/13/2014   Elevated IOP 05/18/2014   Situational insomnia 06/29/2011   Anxiety and depression 06/29/2011    Past Medical History:  Diagnosis Date   Depression    Insomnia    Kidney stones     Past Surgical History:  Procedure Laterality Date    AUGMENTATION MAMMAPLASTY     LITHOTRIPSY  2007-2015   x 3    Social History   Tobacco Use   Smoking status: Never   Smokeless tobacco: Never  Substance Use Topics   Alcohol use: No   Drug use: No    Family History  Problem Relation Age of Onset   Kidney disease Father    Glaucoma Father    Colon cancer Neg Hx    Esophageal cancer Neg Hx    Rectal cancer Neg Hx    Stomach cancer Neg Hx     Allergies  Allergen Reactions   Aripiprazole     Headache, nausea, unable to sleep    Medication list has been reviewed and updated.  Current Outpatient Medications on File Prior to Visit  Medication Sig Dispense Refill   ALPRAZolam (XANAX) 0.5 MG tablet TAKE 1 TO 1 AND 1/2 TABLETS BY MOUTH AT BEDTIME AS NEEDED FOR SLEEP 45 tablet 2   FLUoxetine (PROZAC) 10 MG tablet Take 4 tablets (40 mg total) by mouth daily. 120 tablet 11   SIMBRINZA 1-0.2 % SUSP Place 1 drop into both eyes 3 (three) times daily.     No current facility-administered medications on file prior to visit.    Review of Systems:  As per HPI- otherwise negative.   Physical Examination: Vitals:   05/21/22 1408  BP:  122/80  Pulse: 74  Resp: 18  Temp: 97.8 F (36.6 C)  SpO2: 97%   Vitals:   05/21/22 1408  Weight: 152 lb (68.9 kg)  Height: 5\' 2"  (1.575 m)   Body mass index is 27.8 kg/m. Ideal Body Weight: Weight in (lb) to have BMI = 25: 136.4  GEN: no acute distress.  Mild overweight, looks well HEENT: Atraumatic, Normocephalic.  Ears and Nose: No external deformity. CV: RRR, No M/G/R. No JVD. No thrill. No extra heart sounds. PULM: CTA B, no wheezes, crackles, rhonchi. No retractions. No resp. distress. No accessory muscle use. ABD: S, NT, ND, +BS. No rebound. No HSM. EXTR: No c/c/e PSYCH: Normally interactive. Conversant.   EKG: Normal sinus rhythm Assessment and Plan: Preoperative clearance - Plan: EKG 12-Lead, CBC with Differential/Platelet, Comprehensive metabolic panel, Hemoglobin A1c,  Protime-INR, PTT  Weight gain - Plan: TSH, DISCONTINUED: phentermine 15 MG capsule  Adjustment insomnia - Plan: ALPRAZolam (XANAX) 0.5 MG tablet  Adjustment reaction with anxiety and depression - Plan: ALPRAZolam (XANAX) 0.5 MG tablet Patient seen today for preoperative evaluation Need to fax labs and records to Dr at [phone] She is concerned about weight gain, check TSH today.  I advised that I do not think she would qualify for GLP-1 as she is just mildly overweight.  We have thought to try phentermine but unfortunately this will not work because she does have glaucoma  She uses alprazolam as needed for insomnia, refilled for her today  Will plan further follow- up pending labs.  Signed [phone], MD

## 2022-05-21 ENCOUNTER — Encounter: Payer: Self-pay | Admitting: Family Medicine

## 2022-05-21 ENCOUNTER — Ambulatory Visit: Payer: BC Managed Care – PPO | Admitting: Family Medicine

## 2022-05-21 VITALS — BP 122/80 | HR 74 | Temp 97.8°F | Resp 18 | Ht 62.0 in | Wt 152.0 lb

## 2022-05-21 DIAGNOSIS — F4323 Adjustment disorder with mixed anxiety and depressed mood: Secondary | ICD-10-CM

## 2022-05-21 DIAGNOSIS — F5102 Adjustment insomnia: Secondary | ICD-10-CM | POA: Diagnosis not present

## 2022-05-21 DIAGNOSIS — R635 Abnormal weight gain: Secondary | ICD-10-CM | POA: Diagnosis not present

## 2022-05-21 DIAGNOSIS — Z01818 Encounter for other preprocedural examination: Secondary | ICD-10-CM | POA: Diagnosis not present

## 2022-05-21 LAB — CBC WITH DIFFERENTIAL/PLATELET
Basophils Absolute: 0 10*3/uL (ref 0.0–0.1)
Basophils Relative: 0.8 % (ref 0.0–3.0)
Eosinophils Absolute: 0.1 10*3/uL (ref 0.0–0.7)
Eosinophils Relative: 1.8 % (ref 0.0–5.0)
HCT: 45.6 % (ref 36.0–46.0)
Hemoglobin: 14.9 g/dL (ref 12.0–15.0)
Lymphocytes Relative: 26.1 % (ref 12.0–46.0)
Lymphs Abs: 1.6 10*3/uL (ref 0.7–4.0)
MCHC: 32.7 g/dL (ref 30.0–36.0)
MCV: 89.6 fl (ref 78.0–100.0)
Monocytes Absolute: 0.5 10*3/uL (ref 0.1–1.0)
Monocytes Relative: 8.1 % (ref 3.0–12.0)
Neutro Abs: 3.9 10*3/uL (ref 1.4–7.7)
Neutrophils Relative %: 63.2 % (ref 43.0–77.0)
Platelets: 279 10*3/uL (ref 150.0–400.0)
RBC: 5.09 Mil/uL (ref 3.87–5.11)
RDW: 14.4 % (ref 11.5–15.5)
WBC: 6.2 10*3/uL (ref 4.0–10.5)

## 2022-05-21 LAB — COMPREHENSIVE METABOLIC PANEL
ALT: 22 U/L (ref 0–35)
AST: 20 U/L (ref 0–37)
Albumin: 4.4 g/dL (ref 3.5–5.2)
Alkaline Phosphatase: 107 U/L (ref 39–117)
BUN: 15 mg/dL (ref 6–23)
CO2: 28 mEq/L (ref 19–32)
Calcium: 9.4 mg/dL (ref 8.4–10.5)
Chloride: 105 mEq/L (ref 96–112)
Creatinine, Ser: 0.74 mg/dL (ref 0.40–1.20)
GFR: 91.97 mL/min (ref >60.00)
Glucose, Bld: 91 mg/dL (ref 70–99)
Potassium: 4 mEq/L (ref 3.5–5.1)
Sodium: 140 mEq/L (ref 135–145)
Total Bilirubin: 0.5 mg/dL (ref 0.2–1.2)
Total Protein: 7.7 g/dL (ref 6.0–8.3)

## 2022-05-21 LAB — HEMOGLOBIN A1C: Hgb A1c MFr Bld: 5.4 % (ref 4.6–6.5)

## 2022-05-21 LAB — PROTIME-INR
INR: 0.9 ratio (ref 0.8–1.0)
Prothrombin Time: 10.5 s (ref 9.6–13.1)

## 2022-05-21 LAB — TSH: TSH: 1.78 u[IU]/mL (ref 0.35–5.50)

## 2022-05-21 LAB — APTT: aPTT: 32.3 s (ref 25.4–36.8)

## 2022-05-21 MED ORDER — PHENTERMINE HCL 15 MG PO CAPS: 15.0000 mg | ORAL_CAPSULE | ORAL | 2 refills | Status: DC

## 2022-05-21 MED ORDER — ALPRAZOLAM 0.5 MG PO TABS: ORAL_TABLET | ORAL | 2 refills | Status: DC

## 2022-05-22 ENCOUNTER — Encounter: Payer: Self-pay | Admitting: Family Medicine

## 2022-05-30 DIAGNOSIS — R635 Abnormal weight gain: Secondary | ICD-10-CM | POA: Diagnosis not present

## 2022-06-19 ENCOUNTER — Encounter: Payer: Self-pay | Admitting: Family Medicine

## 2022-06-19 DIAGNOSIS — G4700 Insomnia, unspecified: Secondary | ICD-10-CM

## 2022-06-20 MED ORDER — TRAZODONE HCL 50 MG PO TABS
25.0000 mg | ORAL_TABLET | Freq: Every evening | ORAL | 3 refills | Status: DC | PRN
Start: 2022-06-20 — End: 2022-09-22

## 2022-06-20 NOTE — Addendum Note (Signed)
Addended by: Abbe Amsterdam C on: 06/20/2022 11:46 AM   Modules accepted: Orders

## 2022-06-21 DIAGNOSIS — H2513 Age-related nuclear cataract, bilateral: Secondary | ICD-10-CM | POA: Diagnosis not present

## 2022-06-21 DIAGNOSIS — H401131 Primary open-angle glaucoma, bilateral, mild stage: Secondary | ICD-10-CM | POA: Diagnosis not present

## 2022-06-21 DIAGNOSIS — H21512 Anterior synechiae (iris), left eye: Secondary | ICD-10-CM | POA: Diagnosis not present

## 2022-08-10 ENCOUNTER — Encounter (INDEPENDENT_AMBULATORY_CARE_PROVIDER_SITE_OTHER): Payer: BC Managed Care – PPO | Admitting: Family Medicine

## 2022-08-10 DIAGNOSIS — Z029 Encounter for administrative examinations, unspecified: Secondary | ICD-10-CM | POA: Diagnosis not present

## 2022-08-13 NOTE — Telephone Encounter (Signed)
Please see the MyChart message reply(ies) for my assessment and plan.  The patient gave consent for this Medical Advice Message and is aware that it may result in a bill to their insurance company as well as the possibility that this may result in a co-payment or deductible. They are an established patient, but are not seeking medical advice exclusively about a problem treated during an in person or video visit in the last 7 days. I did not recommend an in person or video visit within 7 days of my reply.  I spent a total of 12 minutes cumulative time within 7 days through MyChart messaging Deasia Chiu, MD  

## 2022-09-04 IMAGING — US US ABDOMEN LIMITED
1 series · 14 of 25 positions shown · non-contrast
Comparison: No priors.

CLINICAL DATA: 55-year-old female with history of elevated liver
function tests.

EXAM:
ULTRASOUND ABDOMEN LIMITED RIGHT UPPER QUADRANT

[Series 1: us abdomen limited · 14 of 50 slices shown]
[im 1/50]
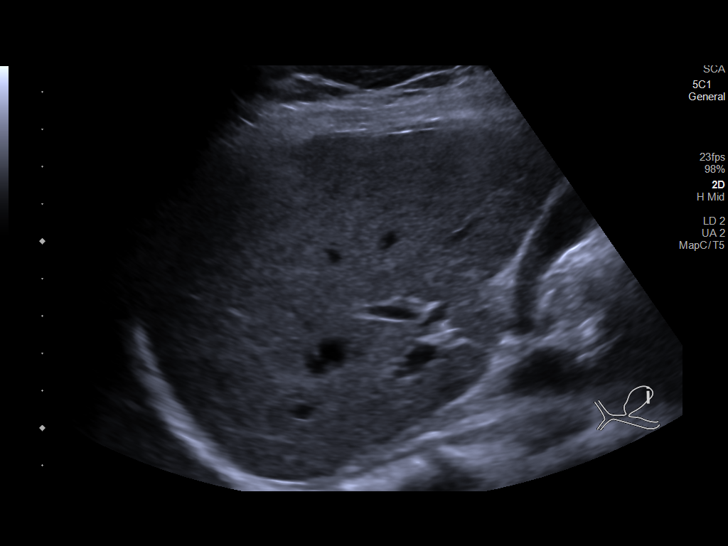
[im 5/50]
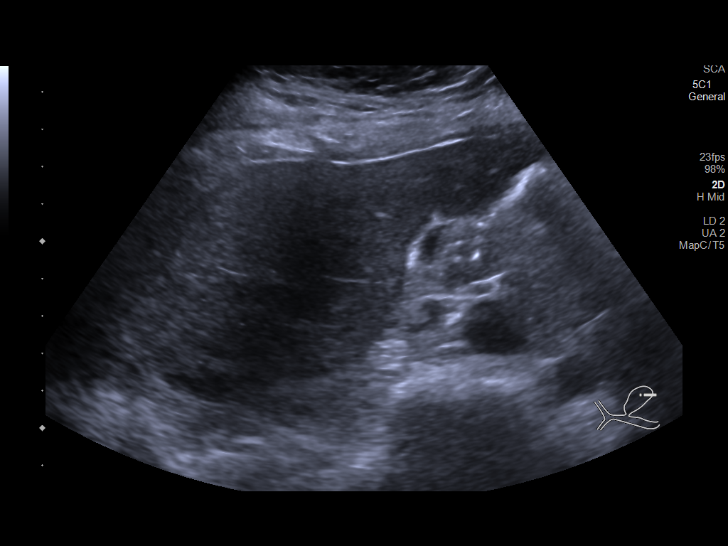
[im 9/50]
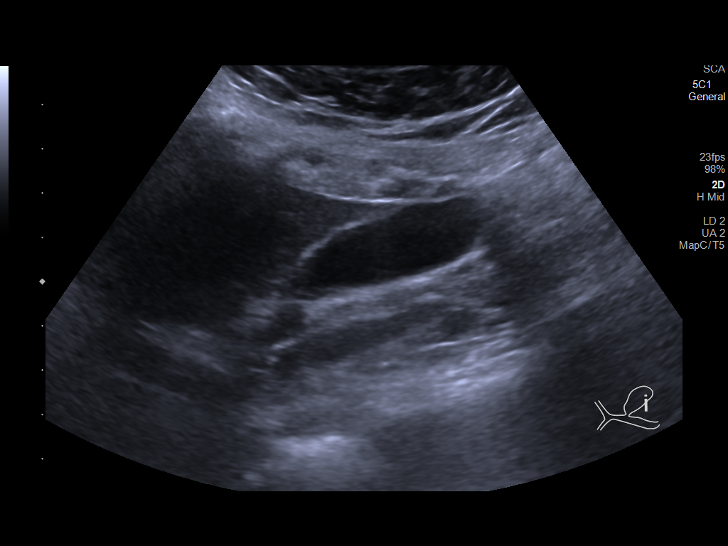
[im 13/50]
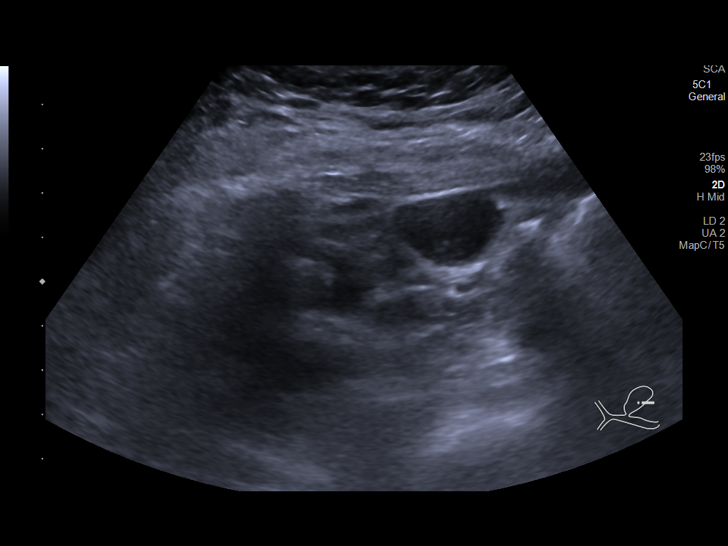
[im 17/50]
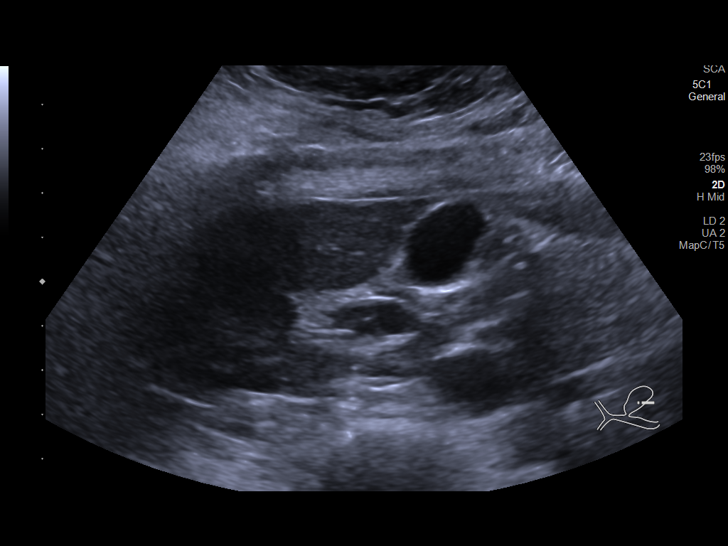
[im 19/50]
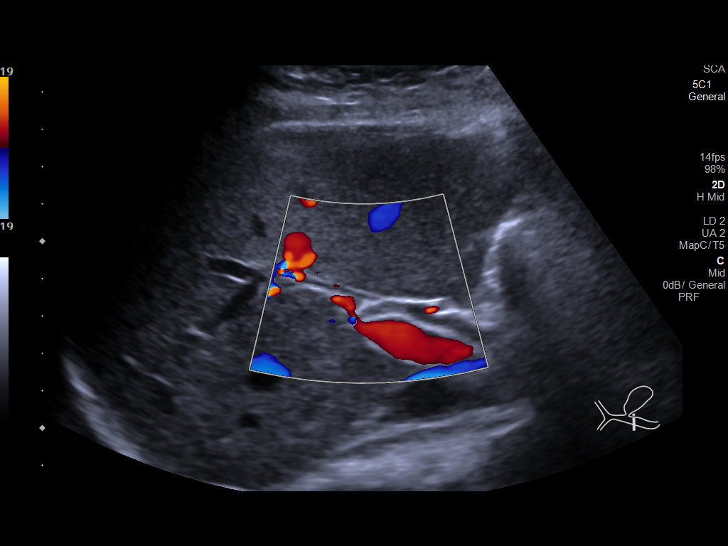
[im 23/50]
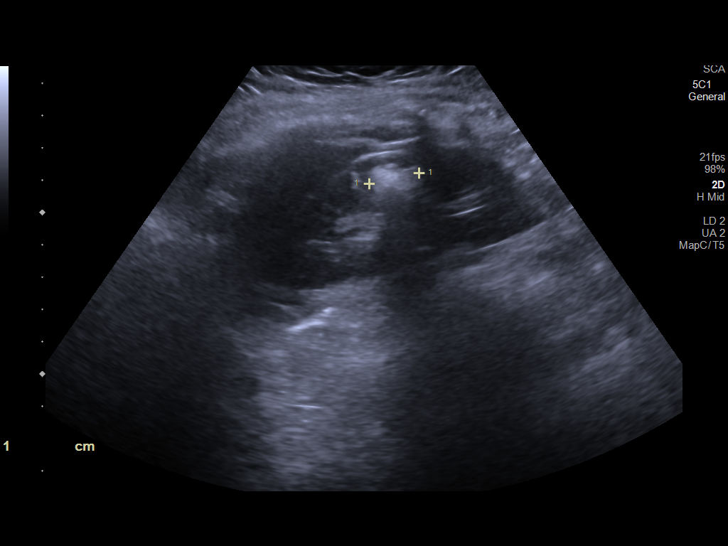
[im 27/50]
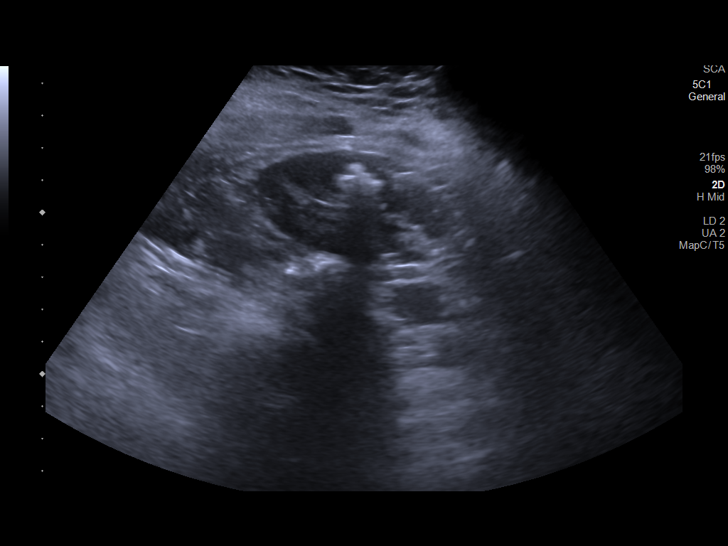
[im 31/50]
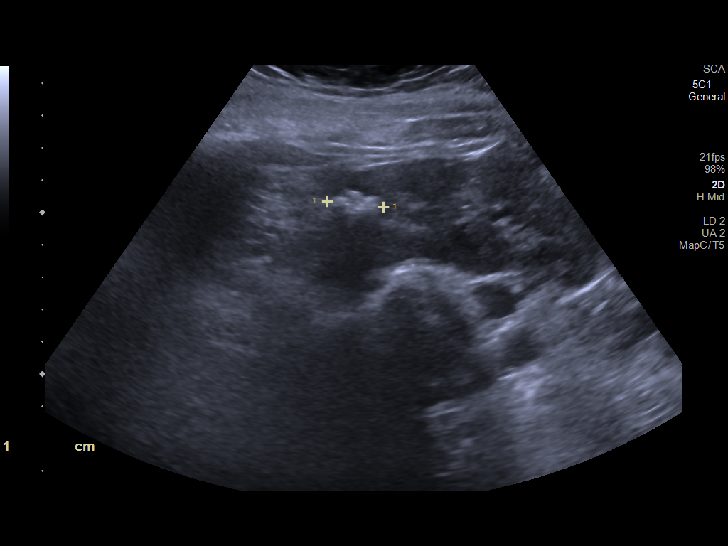
[im 33/50]
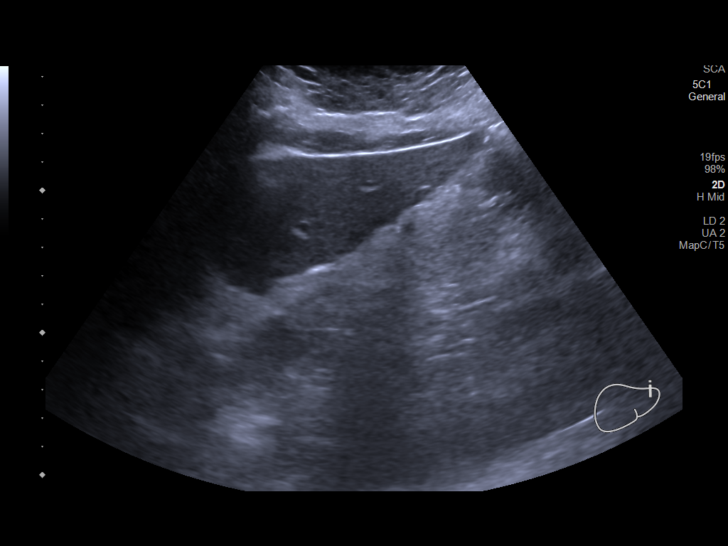
[im 37/50]
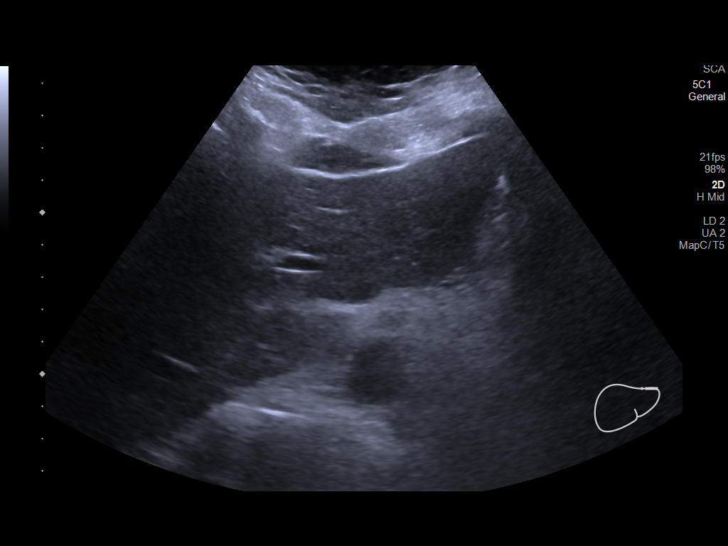
[im 41/50]
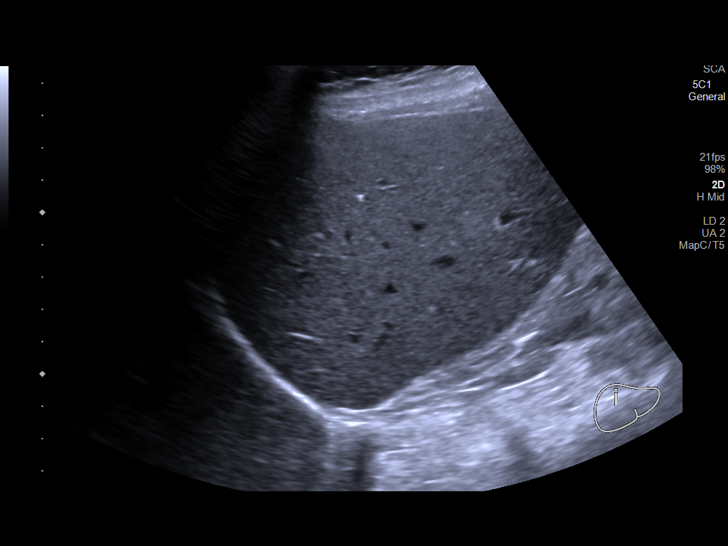
[im 45/50]
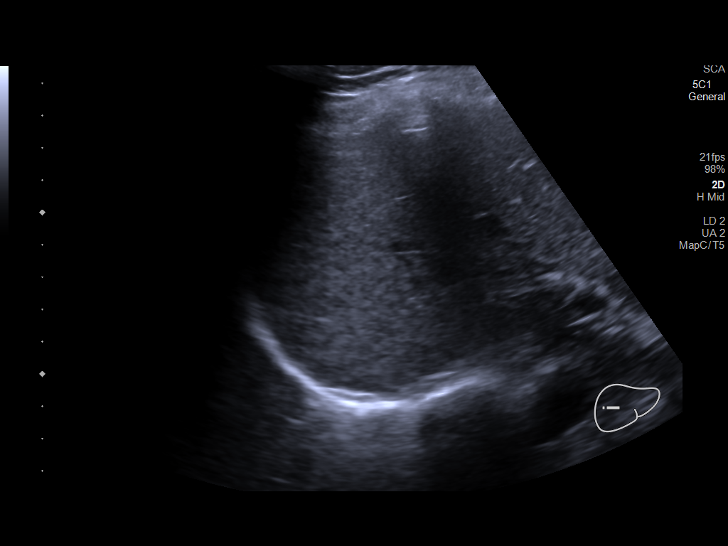
[im 50/50]
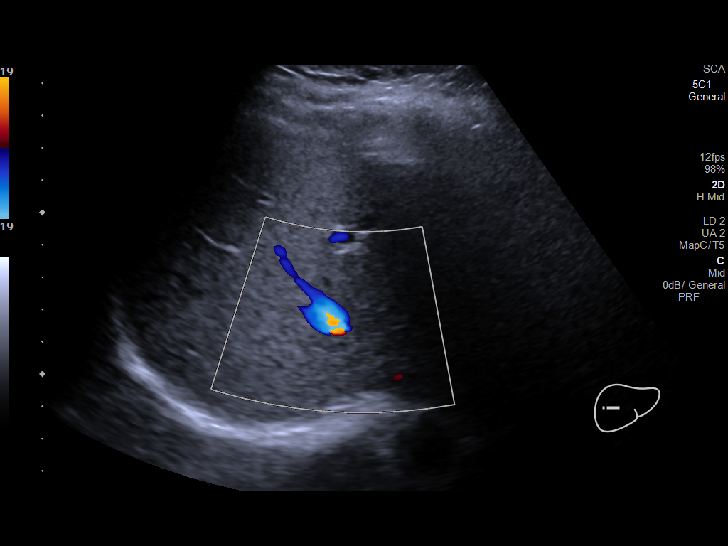

[14 of 25 positions shown; findings below may reference images not displayed]

FINDINGS: Gallbladder:

No gallstones or wall thickening visualized. No sonographic Murphy
sign noted by sonographer.

Common bile duct:

Diameter: 3.3 mm

Liver:

No focal lesion identified. Within normal limits in parenchymal
echogenicity. Portal vein is patent on color Doppler imaging with
normal direction of blood flow towards the liver.

Other: Incidental imaging of the right kidney demonstrates the
presence of multiple echogenic shadowing foci, compatible with
multiple kidney stones measuring up to 1.9 cm.
IMPRESSION: 1. No findings to account for the patient's elevated liver function
tests.
2. Multiple nonobstructive calculi in the right renal collecting
system.

## 2022-09-10 DIAGNOSIS — H21512 Anterior synechiae (iris), left eye: Secondary | ICD-10-CM | POA: Diagnosis not present

## 2022-09-10 DIAGNOSIS — H16223 Keratoconjunctivitis sicca, not specified as Sjogren's, bilateral: Secondary | ICD-10-CM | POA: Diagnosis not present

## 2022-09-10 DIAGNOSIS — H401131 Primary open-angle glaucoma, bilateral, mild stage: Secondary | ICD-10-CM | POA: Diagnosis not present

## 2022-09-22 ENCOUNTER — Other Ambulatory Visit: Payer: Self-pay | Admitting: Family Medicine

## 2022-09-22 DIAGNOSIS — G4700 Insomnia, unspecified: Secondary | ICD-10-CM

## 2022-10-04 ENCOUNTER — Encounter: Payer: Self-pay | Admitting: Family Medicine

## 2022-10-04 DIAGNOSIS — F5102 Adjustment insomnia: Secondary | ICD-10-CM

## 2022-10-04 DIAGNOSIS — F4323 Adjustment disorder with mixed anxiety and depressed mood: Secondary | ICD-10-CM

## 2022-10-05 MED ORDER — ALPRAZOLAM 0.5 MG PO TABS: ORAL_TABLET | ORAL | 2 refills | Status: DC

## 2023-01-02 ENCOUNTER — Other Ambulatory Visit: Payer: Self-pay

## 2023-01-02 DIAGNOSIS — Z1231 Encounter for screening mammogram for malignant neoplasm of breast: Secondary | ICD-10-CM

## 2023-01-30 ENCOUNTER — Ambulatory Visit (INDEPENDENT_AMBULATORY_CARE_PROVIDER_SITE_OTHER): Payer: No Typology Code available for payment source

## 2023-01-30 ENCOUNTER — Other Ambulatory Visit: Payer: Self-pay | Admitting: Family Medicine

## 2023-01-30 DIAGNOSIS — Z1231 Encounter for screening mammogram for malignant neoplasm of breast: Secondary | ICD-10-CM | POA: Diagnosis not present

## 2023-02-04 NOTE — Progress Notes (Unsigned)
[facility] at [facility] [address] [place], [place] [address] [phone]  Date:  02/06/2023   Name:  Cindy Kelly   DOB:  14-Feb-1966   MRN:  [id number]  PCP:  Cindy Cables, MD    Chief Complaint: No chief complaint on file.   History of Present Illness:  Cindy Kelly is a 57 y.o. very pleasant female patient who presents with the following:  Patient seen today for follow-up and to discuss her mammogram-virtual visit Patient location is home, location is office.  Patient identity from with 2 factors, she gives consent for virtual visit today.  The patient myself are present on the call today Most recent visit with myself was last July; at that time she was dealing with some anxiety and depression  Her labs at that time l were normal  Patient Active Problem List   Diagnosis Date Noted   Acne 12/09/2015   Perioral dermatitis 01/19/2015   Vaginitis and vulvovaginitis 01/19/2015   Nausea alone 07/13/2014   Elevated IOP 05/18/2014   Situational insomnia 06/29/2011   Anxiety and depression 06/29/2011    Past Medical History:  Diagnosis Date   Depression    Insomnia    Kidney stones     Past Surgical History:  Procedure Laterality Date   AUGMENTATION MAMMAPLASTY     LITHOTRIPSY  2007-2015   x 3    Social History   Tobacco Use   Smoking status: Never   Smokeless tobacco: Never  Substance Use Topics   Alcohol use: No   Drug use: No    Family History  Problem Relation Age of Onset   Kidney disease Father    Glaucoma Father    Colon cancer Neg Hx    Esophageal cancer Neg Hx    Rectal cancer Neg Hx    Stomach cancer Neg Hx     Allergies  Allergen Reactions   Aripiprazole     Headache, nausea, unable to sleep    Medication list has been reviewed and updated.  Current Outpatient Medications on File Prior to Visit  Medication Sig Dispense Refill   ALPRAZolam (XANAX) 0.5 MG  tablet TAKE 1 TO 1 AND 1/2 TABLETS BY MOUTH AT BEDTIME AS NEEDED FOR SLEEP 45 tablet 2   SIMBRINZA 1-0.2 % SUSP Place 1 drop into both eyes 3 (three) times daily.     traZODone (DESYREL) 50 MG tablet TAKE 1/2 TO 1 TABLET(25 TO 50 MG) BY MOUTH AT BEDTIME AS NEEDED FOR SLEEP 30 tablet 3   No current facility-administered medications on file prior to visit.    Review of Systems:  ***  Physical Examination: There were no vitals filed for this visit. There were no vitals filed for this visit. There is no height or weight on file to calculate BMI. Ideal Body Weight:    ***  Assessment and Plan: ***  Signed Abbe Amsterdam, MD

## 2023-02-06 ENCOUNTER — Telehealth: Payer: No Typology Code available for payment source | Admitting: Family Medicine

## 2023-02-06 DIAGNOSIS — F5102 Adjustment insomnia: Secondary | ICD-10-CM | POA: Diagnosis not present

## 2023-02-06 DIAGNOSIS — G4700 Insomnia, unspecified: Secondary | ICD-10-CM | POA: Diagnosis not present

## 2023-02-06 DIAGNOSIS — F4323 Adjustment disorder with mixed anxiety and depressed mood: Secondary | ICD-10-CM | POA: Diagnosis not present

## 2023-02-06 MED ORDER — ALPRAZOLAM 0.5 MG PO TABS: ORAL_TABLET | ORAL | 2 refills | Status: DC

## 2023-02-06 MED ORDER — TRAZODONE HCL 50 MG PO TABS: ORAL_TABLET | ORAL | 3 refills | Status: DC

## 2023-02-06 MED ORDER — FLUOXETINE HCL 20 MG PO TABS: 20.0000 mg | ORAL_TABLET | Freq: Every day | ORAL | 5 refills | Status: DC

## 2023-02-21 ENCOUNTER — Encounter (INDEPENDENT_AMBULATORY_CARE_PROVIDER_SITE_OTHER): Payer: No Typology Code available for payment source | Admitting: Family Medicine

## 2023-02-21 DIAGNOSIS — F5101 Primary insomnia: Secondary | ICD-10-CM

## 2023-02-22 DIAGNOSIS — F5101 Primary insomnia: Secondary | ICD-10-CM | POA: Diagnosis not present

## 2023-03-04 NOTE — Telephone Encounter (Signed)
Please see the MyChart message reply(ies) for my assessment and plan.  The patient gave consent for this Medical Advice Message and is aware that it may result in a bill to their insurance company as well as the possibility that this may result in a co-payment or deductible. They are an established patient, but are not seeking medical advice exclusively about a problem treated during an in person or video visit in the last 7 days. I did not recommend an in person or video visit within 7 days of my reply.  I spent a total of 15 minutes cumulative time within 7 days through MyChart messaging Abdulrahman Bracey, MD  

## 2023-04-02 NOTE — Telephone Encounter (Signed)
Form has been received and placed in folder.

## 2023-07-14 ENCOUNTER — Other Ambulatory Visit: Payer: Self-pay | Admitting: Family Medicine

## 2023-07-14 DIAGNOSIS — F4323 Adjustment disorder with mixed anxiety and depressed mood: Secondary | ICD-10-CM

## 2023-07-14 DIAGNOSIS — F5102 Adjustment insomnia: Secondary | ICD-10-CM

## 2023-07-15 ENCOUNTER — Encounter: Payer: Self-pay | Admitting: Family Medicine

## 2023-07-15 NOTE — Telephone Encounter (Signed)
Requesting: Xanax 0.5mg  Contract: N/A  UDS: N/A Last Visit: 02/06/2023 VV Next Visit: N/A Last Refill: 02/06/2023  Please Advise

## 2023-07-17 ENCOUNTER — Telehealth: Payer: No Typology Code available for payment source | Admitting: Family Medicine

## 2023-07-17 ENCOUNTER — Encounter: Payer: Self-pay | Admitting: Family Medicine

## 2023-07-17 DIAGNOSIS — F4323 Adjustment disorder with mixed anxiety and depressed mood: Secondary | ICD-10-CM

## 2023-07-17 DIAGNOSIS — R232 Flushing: Secondary | ICD-10-CM

## 2023-07-17 DIAGNOSIS — F5101 Primary insomnia: Secondary | ICD-10-CM | POA: Diagnosis not present

## 2023-07-17 NOTE — Progress Notes (Signed)
[facility] at [facility] [address] [place], [place] [address] [phone]  Date:  07/17/2023   Name:  Cindy Kelly   DOB:  11/13/1965   MRN:  [id number]  PCP:  Cindy Cables, MD    Chief Complaint: 6 month f/u   History of Present Illness:  Cindy Kelly is a 57 y.o. very pleasant female patient who presents with the following:  Patient seen today for virtual visit- connected with pt via mychart video today   I saw her most recently also virtually in April to discuss insomnia Patient location is home, location is office.  Patient identity confirmed with 2 factors, she gives consent for virtual visit today.  The patient and myself are present on the call today   At her visit in April she was using trazodone and alprazolam as needed for sleep She wanted to go back on Prozac as well which had helped her previously-I prescribed fluoxetine 20 and advised her okay to increase to 40 after 1 to 2 weeks- she is currently taking just 20 mg. Advised she can go up to 40 She notes fluoxetine is overall helping her- she still has some hard days but overall she does note improvement  Insomnia is still sometimes a concern for her - she is using trazodone and alprazolam In general this combination does help, but she still has some bad nights  Stress, her job, her thoughts may keep her up  She is taking trazodone 50- advised her ok to try taking up to 100 mg and see if this might help her  She was able to get work accommodations for her insomnia - this is approved through next summer  She is also concerned about HRT- she is having hot flashes and other sx She plans to see her GYN to have her mirena removed soon.  I encouraged her to discuss the possibility of hormone replacement therapy with her gynecologist.  This may help with vasomotor symptoms as well as insomnia  Patient Active Problem List   Diagnosis Date Noted    Acne 12/09/2015   Perioral dermatitis 01/19/2015   Vaginitis and vulvovaginitis 01/19/2015   Nausea alone 07/13/2014   Elevated IOP 05/18/2014   Situational insomnia 06/29/2011   Anxiety and depression 06/29/2011    Past Medical History:  Diagnosis Date   Depression    Insomnia    Kidney stones     Past Surgical History:  Procedure Laterality Date   AUGMENTATION MAMMAPLASTY     LITHOTRIPSY  2007-2015   x 3    Social History   Tobacco Use   Smoking status: Never   Smokeless tobacco: Never  Substance Use Topics   Alcohol use: No   Drug use: No    Family History  Problem Relation Age of Onset   Kidney disease Father    Glaucoma Father    Colon cancer Neg Hx    Esophageal cancer Neg Hx    Rectal cancer Neg Hx    Stomach cancer Neg Hx     Allergies  Allergen Reactions   Aripiprazole     Headache, nausea, unable to sleep    Medication list has been reviewed and updated.  Current Outpatient Medications on File Prior to Visit  Medication Sig Dispense Refill   ALPRAZolam (XANAX) 0.5 MG tablet TAKE 1 TO 1 AND 1/2 TABLETS BY MOUTH AT BEDTIME AS NEEDED FOR SLEEP 45 tablet 0  FLUoxetine (PROZAC) 20 MG tablet Take 1 tablet (20 mg total) by mouth daily. After a week increase to 2 tablets daily 60 tablet 5   SIMBRINZA 1-0.2 % SUSP Place 1 drop into both eyes 3 (three) times daily.     traZODone (DESYREL) 50 MG tablet TAKE 1/2 TO 1 TABLET(25 TO 50 MG) BY MOUTH AT BEDTIME AS NEEDED FOR SLEEP 90 tablet 3   No current facility-administered medications on file prior to visit.    Review of Systems:  As per HPI- otherwise negative.   Physical Examination: There were no vitals filed for this visit. There were no vitals filed for this visit. There is no height or weight on file to calculate BMI. Ideal Body Weight:    Connected with pt via mychart video -she looks well, no distress or shortness of breath is noted  Assessment and Plan: Primary  insomnia  Adjustment reaction with anxiety and depression  Hot flashes  Patient seen today for virtual visit to discuss insomnia and treatment for depression and anxiety:  It was good to see you today!    I am glad the fluoxetine/ prozac seems to be helping. I think you can go up on both the fluoxetine and trazodone (which is for sleep)- but I don't want to go up on both at once! We will increase one at a time   Let's have you first increase fluoxetine to #2 of the 20 mg pill = 40 mg total.  Try this for 2-3 weeks and let me know how this works for you; if you like the 40 mg dose I can prescribe the 40 mg tablet   We can also go up on the trazodone for sleep; can increase trazodone to #2 of the 50 mg strength at bedtime = 100 mg.  This may help you sleep more easily   However- as we discussed you may also benefit from hormone replacement - please touch base with your GYN asap.  If they feel hormone replacement is a good idea this may also help with your sleep issues!   I believe we have mentioned this before, but both fluoxetine and trazodone can increase your serotonin levels.  In fact, this is how they help with depression, anxiety and insomnia!  It is possible to have serotonin levels that are too high, this is a rare occurrenc but is most likely to happen if people were taking multiple drugs that increase serotonin.  If you notice any unusual symptoms or do not feel right after increasing either of these medications please stop the medicine right away and let me know Signed Abbe Amsterdam, MD

## 2023-08-13 ENCOUNTER — Other Ambulatory Visit: Payer: Self-pay | Admitting: Family Medicine

## 2023-08-13 DIAGNOSIS — F4323 Adjustment disorder with mixed anxiety and depressed mood: Secondary | ICD-10-CM

## 2023-09-09 ENCOUNTER — Encounter: Payer: Self-pay | Admitting: Obstetrics and Gynecology

## 2023-09-09 ENCOUNTER — Ambulatory Visit: Payer: No Typology Code available for payment source | Admitting: Obstetrics and Gynecology

## 2023-09-09 VITALS — BP 110/80 | HR 79 | Ht 62.0 in | Wt 144.0 lb

## 2023-09-09 DIAGNOSIS — N760 Acute vaginitis: Secondary | ICD-10-CM | POA: Diagnosis not present

## 2023-09-09 DIAGNOSIS — N951 Menopausal and female climacteric states: Secondary | ICD-10-CM | POA: Diagnosis not present

## 2023-09-09 DIAGNOSIS — Z30432 Encounter for removal of intrauterine contraceptive device: Secondary | ICD-10-CM | POA: Diagnosis not present

## 2023-09-09 DIAGNOSIS — Z7689 Persons encountering health services in other specified circumstances: Secondary | ICD-10-CM

## 2023-09-09 NOTE — Progress Notes (Signed)
NEW GYNECOLOGY PATIENT Patient name: Cindy Kelly MRN [id number]  Date of birth: 19-Oct-1966 Chief Complaint:   Hot Flashes     History:  Cindy Kelly is a 57 y.o. [phone] being seen today to establish care, discuss IUD removal and possible HRT.     Discussed the use of AI scribe software for clinical note transcription with the patient, who gave verbal consent to proceed.  History of Present Illness   The patient, with a history of using an intrauterine device (IUD) for contraception, presents at the five-year mark of having the Mirena IUD in place. She expresses concern about whether she is a candidate for extending the use of the IUD for an additional three years, as suggested by a previous gynecologist. The patient is not currently sexually active and has not had a period since the IUD was put in place.  She reports experiencing symptoms suggestive of menopause, including weight gain and hot flashes, which started around the onset of the COVID-19 pandemic. Despite no changes in her work or eating habits, she has noticed a significant increase in weight from around 121 pounds in 2019 to about 150 pounds currently. This weight gain is distressing to the patient, as she has never experienced such an increase, even post-pregnancy.  The patient has consulted with her primary care physician, who ran a series of tests, all of which came back normal. She has discussed the possibility of hormonal replacement therapy to manage her menopausal symptoms, particularly the hot flashes. However, she is concerned about the potential side effects of such treatment, including the risk of breast cancer and cardiovascular issues.  The patient is considering the removal of the IUD, weighing the pros and cons of keeping it in place. She expresses concern about potential negative side effects of removing the IUD, such as hormonal changes or further weight gain. She is also aware that the longer  the IUD is in place, the harder it might be to remove.         Gynecologic History No LMP recorded. (Menstrual status: IUD). Contraception: IUD Last Pap:     Component Value Date/Time   DIAGPAP  10/11/2021 1605    - Negative for intraepithelial lesion or malignancy (NILM)   HPVHIGH Negative 10/11/2021 1605   ADEQPAP  10/11/2021 1605    Satisfactory for evaluation. The presence or absence of an   ADEQPAP  10/11/2021 1605    endocervical/transformation zone component cannot be determined because   ADEQPAP of atrophy. 10/11/2021 1605    High Risk HPV: Positive  Adequacy:  Satisfactory for evaluation, transformation zone component PRESENT  Diagnosis:  Atypical squamous cells of undetermined significance (ASC-US)  Last Mammogram:  01/2023 BIRADS 1 Last Colonoscopy:  2019  Obstetric History OB History  Gravida Para Term Preterm AB Living  3 3 2 1   3   SAB IAB Ectopic Multiple Live Births          3    # Outcome Date GA Lbr Len/2nd Weight Sex Type Anes PTL Lv  3 Term 2000 [redacted]w[redacted]d   F Vag-Spont EPI N LIV  2 Term 1999 [redacted]w[redacted]d   F Vag-Spont EPI N LIV  1 Preterm 1990 [redacted]w[redacted]d   M Vag-Spont EPI N LIV    Past Medical History:  Diagnosis Date   Depression    Insomnia    Kidney stones     Past Surgical History:  Procedure Laterality Date   AUGMENTATION MAMMAPLASTY     LITHOTRIPSY  2007-2015   x 3    Current Outpatient Medications on File Prior to Visit  Medication Sig Dispense Refill   ALPRAZolam (XANAX) 0.5 MG tablet TAKE 1 TO 1 AND 1/2 TABLETS BY MOUTH AT BEDTIME AS NEEDED FOR SLEEP 45 tablet 0   FLUoxetine (PROZAC) 20 MG tablet TAKE 1 TABLET BY MOUTH DAILY. AFTER A WEEK INCREASE TO 2 TABLETS DAILY. 60 tablet 5   SIMBRINZA 1-0.2 % SUSP Place 1 drop into both eyes 3 (three) times daily.     timolol (BETIMOL) 0.25 % ophthalmic solution 1-2 drops 2 (two) times daily.     timolol (TIMOPTIC) 0.5 % ophthalmic solution Place 1 drop into the left eye 2 (two) times daily.      traZODone (DESYREL) 50 MG tablet TAKE 1/2 TO 1 TABLET(25 TO 50 MG) BY MOUTH AT BEDTIME AS NEEDED FOR SLEEP 90 tablet 3   No current facility-administered medications on file prior to visit.    Allergies  Allergen Reactions   Aripiprazole     Headache, nausea, unable to sleep    Social History:  reports that she has never smoked. She has never used smokeless tobacco. She reports that she does not drink alcohol and does not use drugs.  Family History  Problem Relation Age of Onset   Kidney disease Father    Glaucoma Father    Colon cancer Neg Hx    Esophageal cancer Neg Hx    Rectal cancer Neg Hx    Stomach cancer Neg Hx     The following portions of the patient's history were reviewed and updated as appropriate: allergies, current medications, past family history, past medical history, past social history, past surgical history and problem list.  Review of Systems Pertinent items noted in HPI and remainder of comprehensive ROS otherwise negative.  Physical Exam:  BP 110/80   Pulse 79   Ht 5\' 2"  (1.575 m)   Wt 144 lb (65.3 kg)   BMI 26.34 kg/m  Physical Exam Vitals and nursing note reviewed. Exam conducted with a chaperone present.  Constitutional:      Appearance: Normal appearance.  Cardiovascular:     Rate and Rhythm: Normal rate.  Pulmonary:     Effort: Pulmonary effort is normal.     Breath sounds: Normal breath sounds.  Genitourinary:    General: Normal vulva.     Exam position: Lithotomy position.     Vagina: Normal.     Cervix: Normal.  Neurological:     General: No focal deficit present.     Mental Status: She is alert and oriented to person, place, and time.  Psychiatric:        Mood and Affect: Mood normal.        Behavior: Behavior normal.        Thought Content: Thought content normal.        Judgment: Judgment normal.    IUD Removal  Patient identified, informed consent performed, consent signed.  Patient was in the dorsal lithotomy position,  normal external genitalia was noted.  A speculum was placed in the patient's vagina, normal discharge was noted, no lesions. The cervix was visualized, no lesions, no abnormal discharge.  The strings of the IUD were visualized, grasped and pulled using ring forceps. The IUD was removed in its entirety. . Patient tolerated the procedure well.    Patient will use abstinence for contraception. Routine preventative health maintenance measures emphasized.     Assessment and Plan:    Assessment  and Plan    Mirena IUD At the 5-year mark, patient wishes to have it removed due to concerns about potential internal effects. Discussed the option of extending use to 8 years, but patient prefers removal. No current sexual activity. -Now s/p uncomplicated IUD removal.  Perimenopausal symptoms Patient reports hot flashes and weight gain, suspects menopause. Discussed hormonal and non-hormonal treatment options for hot flashes. Patient is considering options and is not currently interested in starting treatment. -Continue monitoring symptoms. -Low ASCVD risk, low breast cancer ris  Weight gain Patient reports significant weight gain over the past few years, which is causing distress. No changes in diet or activity level. All recent lab work normal. -Continue monitoring weight and consider further evaluation if weight continues to increase or other symptoms develop. -Noted HRT indicated for VSM and may not help with weight loss  General Health Maintenance -Continue regular check-ups with primary care provider.        Routine preventative health maintenance measures emphasized. Please refer to After Visit Summary for other counseling recommendations.   Follow-up: No follow-ups on file.      Lorriane Shire, MD Obstetrician & Gynecologist, Faculty Practice [facility] [employer], [facility]

## 2023-09-17 ENCOUNTER — Other Ambulatory Visit: Payer: Self-pay | Admitting: Family Medicine

## 2023-09-17 DIAGNOSIS — F4323 Adjustment disorder with mixed anxiety and depressed mood: Secondary | ICD-10-CM

## 2023-09-17 DIAGNOSIS — F5102 Adjustment insomnia: Secondary | ICD-10-CM

## 2023-09-18 MED ORDER — ALPRAZOLAM 0.5 MG PO TABS: ORAL_TABLET | ORAL | 1 refills | Status: DC

## 2023-09-18 NOTE — Telephone Encounter (Signed)
Requesting: Xanax 0.5mg  Contract: N/A UDS: N/A Last Visit: 07/17/2023 VV 05/21/2022 OV Next Visit: N/A Last Refill: 07/15/2023  Please Advise

## 2024-01-01 ENCOUNTER — Other Ambulatory Visit: Payer: Self-pay | Admitting: Family Medicine

## 2024-01-01 DIAGNOSIS — Z1231 Encounter for screening mammogram for malignant neoplasm of breast: Secondary | ICD-10-CM

## 2024-01-25 ENCOUNTER — Other Ambulatory Visit: Payer: Self-pay | Admitting: Family Medicine

## 2024-01-25 DIAGNOSIS — F4323 Adjustment disorder with mixed anxiety and depressed mood: Secondary | ICD-10-CM

## 2024-02-03 ENCOUNTER — Other Ambulatory Visit: Payer: Self-pay | Admitting: Family Medicine

## 2024-02-03 DIAGNOSIS — G4700 Insomnia, unspecified: Secondary | ICD-10-CM

## 2024-02-26 ENCOUNTER — Ambulatory Visit

## 2024-02-26 DIAGNOSIS — Z1231 Encounter for screening mammogram for malignant neoplasm of breast: Secondary | ICD-10-CM | POA: Diagnosis not present

## 2024-02-27 ENCOUNTER — Encounter: Payer: Self-pay | Admitting: Family Medicine

## 2024-02-27 DIAGNOSIS — R928 Other abnormal and inconclusive findings on diagnostic imaging of breast: Secondary | ICD-10-CM

## 2024-02-28 NOTE — Addendum Note (Signed)
 Addended by: Gates Kasal C on: 02/28/2024 08:27 AM   Modules accepted: Orders

## 2024-03-05 ENCOUNTER — Encounter: Payer: Self-pay | Admitting: Family Medicine

## 2024-03-05 ENCOUNTER — Other Ambulatory Visit: Payer: Self-pay | Admitting: Family Medicine

## 2024-03-05 DIAGNOSIS — F5102 Adjustment insomnia: Secondary | ICD-10-CM

## 2024-03-05 DIAGNOSIS — F4323 Adjustment disorder with mixed anxiety and depressed mood: Secondary | ICD-10-CM

## 2024-03-05 DIAGNOSIS — F5104 Psychophysiologic insomnia: Secondary | ICD-10-CM

## 2024-03-06 ENCOUNTER — Encounter (HOSPITAL_COMMUNITY): Payer: Self-pay

## 2024-03-08 ENCOUNTER — Other Ambulatory Visit: Payer: Self-pay | Admitting: Family Medicine

## 2024-03-08 DIAGNOSIS — F4323 Adjustment disorder with mixed anxiety and depressed mood: Secondary | ICD-10-CM

## 2024-03-13 ENCOUNTER — Ambulatory Visit
Admission: RE | Admit: 2024-03-13 | Discharge: 2024-03-13 | Disposition: A | Discharge status: Home / Self Care | Source: Ambulatory Visit | Attending: Family Medicine | Admitting: Family Medicine

## 2024-03-13 ENCOUNTER — Ambulatory Visit

## 2024-03-13 DIAGNOSIS — R928 Other abnormal and inconclusive findings on diagnostic imaging of breast: Secondary | ICD-10-CM

## 2024-03-14 NOTE — Progress Notes (Signed)
 [facility] at [facility] [address] [place], [place] [address] [phone]  Date:  03/18/2024   Name:  Cindy Kelly   DOB:  03-01-1966   MRN:  [id number]  PCP:  Kaylee Partridge, MD    Chief Complaint: Insomnia (Onset 2008 /Pt states she has always had trouble sleeping, "now its seems to get better and then get worse again" )   History of Present Illness:  Cindy Kelly is a 58 y.o. 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  She can work from home as needed if she is too drowsy to safely drive  She gets in bed around 8pm, may fall asleep 1-2 hours later.  She has to get up around 7am on days she has early work, gets gets up later on days that she has a later shift  Tetanus- give today  Shingrix-encouraged, she declines today Mammo and pap UTD Colon UTD Can update labs if she would like  She is taking 100 mg of trazodone  at bedtime She takes 0.5- 1mg  of alprazolam  at bedtime as well   She did a sleep study back in 2010 Pt notes her insomnia started around the time of her divorce years ago If she has something to do in the am she has more anxiety and more difficulty sleeping  She did use ambien  at some point in the past -patient reports she has previously tried several different medications for sleep.  They might work well at first but then her body would "get used to  them"   Patient Active Problem List   Diagnosis Date Noted   Acne 12/09/2015   Perioral dermatitis 01/19/2015   Vaginitis and vulvovaginitis 01/19/2015   Nausea alone 07/13/2014   Elevated IOP 05/18/2014   Situational insomnia 06/29/2011   Anxiety and depression 06/29/2011    Past Medical History:  Diagnosis Date   Depression    Insomnia    Kidney stones     Past Surgical History:  Procedure Laterality Date   AUGMENTATION MAMMAPLASTY     LITHOTRIPSY  2007-2015   x 3    Social History   Tobacco Use   Smoking status: Never   Smokeless tobacco: Never  Substance Use Topics   Alcohol use: No   Drug use: No    Family History  Problem Relation Age of Onset   Kidney disease Father    Glaucoma Father    Colon cancer Neg Hx    Esophageal cancer Neg Hx    Rectal cancer Neg Hx    Stomach cancer Neg Hx     Allergies  Allergen Reactions   Aripiprazole     Headache, nausea, unable to sleep    Medication list has been reviewed and updated.  Current Outpatient Medications on File Prior to Visit  Medication Sig Dispense Refill   ALPRAZolam  (XANAX ) 0.5 MG tablet  TAKE 1 TO 1 AND 1/2 TABLETS BY MOUTH AT BEDTIME AS NEEDED FOR SLEEP 30 tablet 0   FLUoxetine  (PROZAC ) 20 MG tablet Take 2 tablets (40 mg total) by mouth daily. 180 tablet 0   SIMBRINZA 1-0.2 % SUSP Place 1 drop into both eyes 3 (three) times daily.     timolol (BETIMOL) 0.25 % ophthalmic solution 1-2 drops 2 (two) times daily.     timolol (TIMOPTIC) 0.5 % ophthalmic solution Place 1 drop into the left eye 2 (two) times daily.     traZODone  (DESYREL ) 50 MG tablet TAKE 1/2 TO 1 TABLET(25 TO 50 MG) BY MOUTH AT BEDTIME AS NEEDED FOR SLEEP 90 tablet 3   No current facility-administered medications on file prior to visit.    Review of Systems:  As per HPI- otherwise negative.   Physical Examination: Vitals:   03/18/24 0826  BP: 110/68  Pulse: 82  SpO2: 99%   Vitals:   03/18/24 0826  Weight: 138 lb  (62.6 kg)  Height: 5\' 2"  (1.575 m)   Body mass index is 25.24 kg/m. Ideal Body Weight: Weight in (lb) to have BMI = 25: 136.4  GEN: no acute distress.  Normal weight, looks well  HEENT: Atraumatic, Normocephalic.  Bilateral TM wnl, oropharynx normal.  PEERL,EOMI.   Ears and Nose: No external deformity. CV: RRR, No M/G/R. No JVD. No thrill. No extra heart sounds. PULM: CTA B, no wheezes, crackles, rhonchi. No retractions. No resp. distress. No accessory muscle use. ABD: S, NT, ND. No rebound. No HSM. EXTR: No c/c/e PSYCH: Normally interactive. Conversant.    Assessment and Plan: Primary insomnia  Adjustment reaction with anxiety and depression  Screening, lipid - Plan: Lipid panel  Screening for diabetes mellitus - Plan: Comprehensive metabolic panel with GFR, Hemoglobin A1c  Thyroid  disorder screening - Plan: TSH  Screening for deficiency anemia - Plan: CBC  Immunization due - Plan: Td vaccine greater than or equal to 7yo preservative free IM  Patient seen today with concern of chronic insomnia.  She is currently taking alprazolam  as well as trazodone  at bedtime, fluoxetine  during the day.  I encouraged her to look into counseling/therapy which might be helpful for her long-term anxiety.  She will think about this  Routine blood work is pending as above  Gave tetanus vaccine  Will plan further follow- up pending labs.   Signed Gates Kasal, MD  Received labs as below, message to patient  Results for orders placed or performed in visit on 03/18/24  CBC   Collection Time: 03/18/24  8:59 AM  Result Value Ref Range   WBC 5.1 4.0 - 10.5 K/uL   RBC 5.19 (H) 3.87 - 5.11 Mil/uL   Platelets 334.0 150.0 - 400.0 K/uL   Hemoglobin 15.5 (H) 12.0 - 15.0 g/dL   HCT 16.1 (H) 09.6 - 04.5 %   MCV 88.9 78.0 - 100.0 fl   MCHC 33.5 30.0 - 36.0 g/dL   RDW 40.9 81.1 - 91.4 %  Comprehensive metabolic panel with GFR   Collection Time: 03/18/24  8:59 AM  Result Value Ref Range    Sodium 139 135 - 145 mEq/L   Potassium 4.3 3.5 - 5.1 mEq/L   Chloride 103 96 - 112 mEq/L   CO2 29 19 - 32 mEq/L   Glucose, Bld 92 70 - 99 mg/dL   BUN 22 6 - 23 mg/dL   Creatinine, Ser 7.82 0.40 - 1.20 mg/dL   Total Bilirubin 0.8 0.2 - 1.2 mg/dL  Alkaline Phosphatase 111 39 - 117 U/L   AST 13 0 - 37 U/L   ALT 12 0 - 35 U/L   Total Protein 7.4 6.0 - 8.3 g/dL   Albumin 4.4 3.5 - 5.2 g/dL   GFR 32.44 >01.02 mL/min   Calcium  9.3 8.4 - 10.5 mg/dL  Hemoglobin V2Z   Collection Time: 03/18/24  8:59 AM  Result Value Ref Range   Hgb A1c MFr Bld 5.3 4.6 - 6.5 %  Lipid panel   Collection Time: 03/18/24  8:59 AM  Result Value Ref Range   Cholesterol 175 0 - 200 mg/dL   Triglycerides 36.6 0.0 - 149.0 mg/dL   HDL 44.03 >47.42 mg/dL   VLDL 59.5 0.0 - 63.8 mg/dL   LDL Cholesterol 756 (H) 0 - 99 mg/dL   Total CHOL/HDL Ratio 3    NonHDL 118.61   TSH   Collection Time: 03/18/24  8:59 AM  Result Value Ref Range   TSH 1.21 0.35 - 5.50 uIU/mL

## 2024-03-14 NOTE — Patient Instructions (Addendum)
 Good to see you again today- I am sorry you have such trouble with sleep!  I do think doing some counseling/ therapy might be helpful for you There are online options such as [email or web address] that might work well for your schedule.  Also most local therapists offer virtual visits these days!    I will be in touch with your labs Tetanus given today Recommend the shingles vaccine series  Please see me in about 6 months assuming all is well

## 2024-03-17 NOTE — Telephone Encounter (Signed)
 Please see the MyChart message reply(ies) for my assessment and plan.  The patient gave consent for this Medical Advice Message and is aware that it may result in a bill to their insurance company as well as the possibility that this may result in a co-payment or deductible. They are an established patient, but are not seeking medical advice exclusively about a problem treated during an in person or video visit in the last 7 days. I did not recommend an in person or video visit within 7 days of my reply.  I spent a total of 10 minutes cumulative time within 7 days through MyChart messaging Completed leave forms for her chronic insomnia  Gates Kasal, MD'

## 2024-03-18 ENCOUNTER — Encounter: Payer: Self-pay | Admitting: Family Medicine

## 2024-03-18 ENCOUNTER — Ambulatory Visit: Admitting: Family Medicine

## 2024-03-18 VITALS — BP 110/68 | HR 82 | Ht 62.0 in | Wt 138.0 lb

## 2024-03-18 DIAGNOSIS — Z13220 Encounter for screening for lipoid disorders: Secondary | ICD-10-CM | POA: Diagnosis not present

## 2024-03-18 DIAGNOSIS — F5101 Primary insomnia: Secondary | ICD-10-CM

## 2024-03-18 DIAGNOSIS — Z23 Encounter for immunization: Secondary | ICD-10-CM

## 2024-03-18 DIAGNOSIS — Z130 Encounter for screening for diseases of the blood and blood-forming organs and certain disorders involving the immune mechanism: Secondary | ICD-10-CM | POA: Diagnosis not present

## 2024-03-18 DIAGNOSIS — Z1329 Encounter for screening for other suspected endocrine disorder: Secondary | ICD-10-CM | POA: Diagnosis not present

## 2024-03-18 DIAGNOSIS — Z131 Encounter for screening for diabetes mellitus: Secondary | ICD-10-CM

## 2024-03-18 DIAGNOSIS — F4323 Adjustment disorder with mixed anxiety and depressed mood: Secondary | ICD-10-CM | POA: Diagnosis not present

## 2024-03-18 LAB — LIPID PANEL
Cholesterol: 175 mg/dL (ref 0–200)
HDL: 56.3 mg/dL (ref >39.00)
LDL Cholesterol: 106 mg/dL — ABNORMAL HIGH (ref 0–99)
NonHDL: 118.61
Total CHOL/HDL Ratio: 3
Triglycerides: 63 mg/dL (ref 0.0–149.0)
VLDL: 12.6 mg/dL (ref 0.0–40.0)

## 2024-03-18 LAB — CBC
HCT: 46.2 % — ABNORMAL HIGH (ref 36.0–46.0)
Hemoglobin: 15.5 g/dL — ABNORMAL HIGH (ref 12.0–15.0)
MCHC: 33.5 g/dL (ref 30.0–36.0)
MCV: 88.9 fl (ref 78.0–100.0)
Platelets: 334 10*3/uL (ref 150.0–400.0)
RBC: 5.19 Mil/uL — ABNORMAL HIGH (ref 3.87–5.11)
RDW: 13.4 % (ref 11.5–15.5)
WBC: 5.1 10*3/uL (ref 4.0–10.5)

## 2024-03-18 LAB — COMPREHENSIVE METABOLIC PANEL WITH GFR
ALT: 12 U/L (ref 0–35)
AST: 13 U/L (ref 0–37)
Albumin: 4.4 g/dL (ref 3.5–5.2)
Alkaline Phosphatase: 111 U/L (ref 39–117)
BUN: 22 mg/dL (ref 6–23)
CO2: 29 meq/L (ref 19–32)
Calcium: 9.3 mg/dL (ref 8.4–10.5)
Chloride: 103 meq/L (ref 96–112)
Creatinine, Ser: 0.84 mg/dL (ref 0.40–1.20)
GFR: 77.99 mL/min (ref >60.00)
Glucose, Bld: 92 mg/dL (ref 70–99)
Potassium: 4.3 meq/L (ref 3.5–5.1)
Sodium: 139 meq/L (ref 135–145)
Total Bilirubin: 0.8 mg/dL (ref 0.2–1.2)
Total Protein: 7.4 g/dL (ref 6.0–8.3)

## 2024-03-18 LAB — TSH: TSH: 1.21 u[IU]/mL (ref 0.35–5.50)

## 2024-03-18 LAB — HEMOGLOBIN A1C: Hgb A1c MFr Bld: 5.3 % (ref 4.6–6.5)

## 2024-04-28 ENCOUNTER — Encounter: Payer: Self-pay | Admitting: Family Medicine

## 2024-05-20 ENCOUNTER — Emergency Department (HOSPITAL_BASED_OUTPATIENT_CLINIC_OR_DEPARTMENT_OTHER)

## 2024-05-20 ENCOUNTER — Encounter (HOSPITAL_BASED_OUTPATIENT_CLINIC_OR_DEPARTMENT_OTHER): Payer: Self-pay

## 2024-05-20 ENCOUNTER — Emergency Department (HOSPITAL_BASED_OUTPATIENT_CLINIC_OR_DEPARTMENT_OTHER)
Admission: EM | Admit: 2024-05-20 | Discharge: 2024-05-20 | Disposition: A | Discharge status: Home / Self Care | Attending: Emergency Medicine | Admitting: Emergency Medicine

## 2024-05-20 ENCOUNTER — Ambulatory Visit: Payer: Self-pay

## 2024-05-20 ENCOUNTER — Other Ambulatory Visit: Payer: Self-pay

## 2024-05-20 DIAGNOSIS — W01198A Fall on same level from slipping, tripping and stumbling with subsequent striking against other object, initial encounter: Secondary | ICD-10-CM | POA: Insufficient documentation

## 2024-05-20 DIAGNOSIS — S060X9A Concussion with loss of consciousness of unspecified duration, initial encounter: Secondary | ICD-10-CM | POA: Diagnosis not present

## 2024-05-20 DIAGNOSIS — M542 Cervicalgia: Secondary | ICD-10-CM | POA: Diagnosis not present

## 2024-05-20 DIAGNOSIS — Y9301 Activity, walking, marching and hiking: Secondary | ICD-10-CM | POA: Insufficient documentation

## 2024-05-20 DIAGNOSIS — S0083XA Contusion of other part of head, initial encounter: Secondary | ICD-10-CM | POA: Diagnosis not present

## 2024-05-20 DIAGNOSIS — S0990XA Unspecified injury of head, initial encounter: Secondary | ICD-10-CM | POA: Diagnosis present

## 2024-05-20 NOTE — ED Notes (Signed)
 Pt alert and oriented X 4 at the time of discharge. RR even and unlabored. No acute distress noted. Pt verbalized understanding of discharge instructions as discussed. Pt ambulatory to lobby at time of discharge.

## 2024-05-20 NOTE — ED Triage Notes (Signed)
 Pt reports that she woke up to go to the bathroom at 0045, hit her head on the wall and fell to the ground. Pt has lac to lip, over left eye and bruising to face. Also endorses neck pain   Per daughter pt had LOC for about 30 sec and also lost control of her bladder. No blood thinners

## 2024-05-20 NOTE — Telephone Encounter (Signed)
 Pt had fall during the night, daughter heard the fall and responded. Daughter found pt alert and oriented on the flood. While speaking to the pt, daughter states that her mother went unconscious for about 30-40 seconds, during this time pt had urinary incontinence. Daughter denies pt having had jerking or shaking movements. Daughter states that pt is alert and oriented now. Denies blood thinner use.   FYI Only or Action Required?: FYI only for provider.  Patient was last seen in primary care on 03/18/2024 by Copland, Harlene BROCKS, MD.  Called Nurse Triage reporting Fall.  Symptoms began yesterday.  Interventions attempted: Nothing.  Symptoms are: unchanged.  Triage Disposition: Go to ED Now (or PCP Triage)  Patient/caregiver understands and will follow disposition?: Yes, will follow disposition  Copied from CRM #[id number]. Topic: Clinical - Red Word Triage >> May 20, 2024  8:59 AM Charlet HERO wrote: Red Word that prompted transfer to Nurse Triage: patients daughter Alan is calling bc she fell on the way to the bathroom, she hit head was unconscious for about 30-40 seconds also lost control of bladder. Reason for Disposition  [1] Age > 50 years AND [2] now alert and feels fine  Protocols used: Fainting-A-AH

## 2024-05-20 NOTE — ED Provider Notes (Signed)
 [facility] Provider Note   CSN: [id number] Arrival date & time: 05/20/24  1032     Patient presents with: [name] is a 58 y.o. female.   Patient reports that she takes a medication to help her sleep.  Patient reports that she woke up and needed to go to the bathroom.  Patient tells me that she walked into a door frame and hit her head.  Patient states she fell to the floor.  Patient's daughter who is with her states that she heard the patient fall.  Daughter said that patient lost consciousness.  Daughter reports that patient did lose control of her urine.  Daughter was able to wake patient up.  She has been acting normally since the injury.  Patient complains of pain in her face and pain in her neck.  Patient denies any nausea or vomiting.  She is not having any visual change she is not having any hearing change.  Patient is able to move all extremities.  She is able to walk without any difficulty.  The history is provided by the patient. No language interpreter was used.  Fall       Prior to Admission medications   Medication Sig Start Date End Date Taking? Authorizing Provider  ALPRAZolam  (XANAX ) 0.5 MG tablet TAKE 1 TO 1 AND 1/2 TABLETS BY MOUTH AT BEDTIME AS NEEDED FOR SLEEP 03/05/24   Copland, Harlene BROCKS, MD  FLUoxetine  (PROZAC ) 20 MG tablet Take 2 tablets (40 mg total) by mouth daily. 03/09/24   Copland, Jessica C, MD  SIMBRINZA 1-0.2 % SUSP Place 1 drop into both eyes 3 (three) times daily. 03/16/14   [provider]  timolol (BETIMOL) 0.25 % ophthalmic solution 1-2 drops 2 (two) times daily.    [provider]  timolol (TIMOPTIC) 0.5 % ophthalmic solution Place 1 drop into the left eye 2 (two) times daily.    [provider]  traZODone  (DESYREL ) 50 MG tablet TAKE 1/2 TO 1 TABLET(25 TO 50 MG) BY MOUTH AT BEDTIME AS NEEDED FOR SLEEP 02/03/24   Copland, Harlene BROCKS, MD    Allergies:  Aripiprazole    Review of Systems  All other systems reviewed and are negative.   Updated Vital Signs BP 100/73 (BP Location: Left Arm)   Pulse 95   Temp 98.4 F (36.9 C) (Oral)   Resp 18   Ht 5' 2 (1.575 m)   Wt 55.8 kg   SpO2 99%   BMI 22.50 kg/m   Physical Exam Vitals and nursing note reviewed.  Constitutional:      Appearance: She is well-developed.  HENT:     Head: Normocephalic.     Comments: Abrasion left eyelid, red, bruised area left cheek tender to palpation    Right Ear: External ear normal.     Left Ear: External ear normal.     Nose: Nose normal.     Mouth/Throat:     Mouth: Mucous membranes are moist.  Eyes:     Extraocular Movements: Extraocular movements intact.     Pupils: Pupils are equal, round, and reactive to light.  Neck:     Comments: Tender cervical spine diffusely Cardiovascular:     Rate and Rhythm: Normal rate.  Pulmonary:     Effort: Pulmonary effort is normal.  Abdominal:     General: There is no distension.  Musculoskeletal:        General: Normal range of motion.  Cervical back: Normal range of motion.  Skin:    General: Skin is warm.  Neurological:     General: No focal deficit present.     Mental Status: She is alert and oriented to person, place, and time.     Cranial Nerves: No cranial nerve deficit.     Motor: No weakness.  Psychiatric:        Mood and Affect: Mood normal.     (all labs ordered are listed, but only abnormal results are displayed) Labs Reviewed - No data to display  EKG: None  Radiology: CT Maxillofacial Wo Contrast Result Date: 05/20/2024 CLINICAL DATA:  Facial trauma, blunt EXAM: CT MAXILLOFACIAL WITHOUT CONTRAST TECHNIQUE: Multidetector CT imaging of the maxillofacial structures was performed. Multiplanar CT image reconstructions were also generated. RADIATION DOSE REDUCTION: This exam was performed according to the departmental dose-optimization program which includes automated exposure  control, adjustment of the mA and/or kV according to patient size and/or use of iterative reconstruction technique. COMPARISON:  None Available. FINDINGS: Osseous: The facial bones are intact and unremarkable. There are no osseous lesions present. The temporomandibular joints are unremarkable. Orbits: Normal. Sinuses: Clear paranasal sinuses. Soft tissues: No significant soft tissue swelling or soft tissue injury evident. Limited intracranial: Negative. IMPRESSION: Normal. Electronically Signed   By: Evalene Coho M.D.   On: 05/20/2024 12:39   CT Head Wo Contrast Result Date: 05/20/2024 CLINICAL DATA:  Head trauma, moderate-severe loc EXAM: CT HEAD WITHOUT CONTRAST TECHNIQUE: Contiguous axial images were obtained from the base of the skull through the vertex without intravenous contrast. RADIATION DOSE REDUCTION: This exam was performed according to the departmental dose-optimization program which includes automated exposure control, adjustment of the mA and/or kV according to patient size and/or use of iterative reconstruction technique. COMPARISON:  None Available. FINDINGS: Brain: Normal brain. No evidence of hemorrhage, mass, cortical infarct or hydrocephalus. Vascular: Negative. Skull: Intact and unremarkable. Sinuses/Orbits: Clear paranasal sinuses and mastoid air cells. Normal orbits. Other: None. IMPRESSION: Normal. Electronically Signed   By: Evalene Coho M.D.   On: 05/20/2024 12:37   CT Cervical Spine Wo Contrast Result Date: 05/20/2024 CLINICAL DATA:  Facial trauma/blood. Fall last night with facial injury. EXAM: CT CERVICAL SPINE WITHOUT CONTRAST TECHNIQUE: Multidetector CT imaging of the cervical spine was performed without intravenous contrast. Multiplanar CT image reconstructions were also generated. RADIATION DOSE REDUCTION: This exam was performed according to the departmental dose-optimization program which includes automated exposure control, adjustment of the mA and/or kV according  to patient size and/or use of iterative reconstruction technique. COMPARISON:  None Available. FINDINGS: Alignment: Straightening without focal angulation or listhesis. Skull base and vertebrae: No evidence of acute cervical spine fracture or traumatic subluxation. Small fragmented osteophytes along the anterior inferior corner of C5 do not appear acute. Soft tissues and spinal canal: No prevertebral fluid or swelling. No visible canal hematoma. Disc levels: Multilevel spondylosis with disc space narrowing, endplate osteophytes and uncinate spurring from C3-4 through C5-6. Associated mild-to-moderate osseous foraminal narrowing, greatest on the left at C4-5 and bilaterally at C5-6. No large disc herniation or high-grade spinal stenosis demonstrated. Upper chest: Clear lung apices. Other: None. IMPRESSION: 1. No evidence of acute cervical spine fracture, traumatic subluxation or static signs of instability. 2. Multilevel cervical spondylosis with associated mild-to-moderate osseous foraminal narrowing as described. Electronically Signed   By: Elsie Perone M.D.   On: 05/20/2024 12:26     Procedures   Medications Ordered in the ED - No data to display  Medical Decision Making Patient reports this a.m. she walked into a door frame.  Patient reports she was sedated by her sleep medicine.  Patient hit her head and per her daughter lost consciousness.  Patient lost control of her urine.  Amount and/or Complexity of Data Reviewed Independent Historian:     Details: Patient is here with her daughter who is supportive daughter helps provide history Radiology: ordered and independent interpretation performed. Decision-making details documented in ED Course.    Details: CT head, CT cervical spine and CT maxillofacial showed no evidence of fracture or brain injury.  Risk Risk Details: Patient is counseled on results.  Patient is given instructions on head injury.  Patient  is advised to discuss her medications with her primary care physician.  She is advised to avoid oversedation.        Final diagnoses:  Contusion of face, initial encounter  Neck pain  Concussion with loss of consciousness, initial encounter    ED Discharge Orders     None       An After Visit Summary was printed and given to the patient.    Fl[place] Sonny POUR, PA-C 05/20/24 1332    Cindy Kelly BROCKS, MD 05/20/24 [phone]

## 2024-05-20 NOTE — Telephone Encounter (Signed)
FYI. Pt going to ED.  

## 2024-05-20 NOTE — Discharge Instructions (Addendum)
 Tyleno for discomfort

## 2024-06-25 ENCOUNTER — Other Ambulatory Visit: Payer: Self-pay | Admitting: Family Medicine

## 2024-06-25 DIAGNOSIS — F5102 Adjustment insomnia: Secondary | ICD-10-CM

## 2024-06-25 DIAGNOSIS — F4323 Adjustment disorder with mixed anxiety and depressed mood: Secondary | ICD-10-CM

## 2024-07-01 ENCOUNTER — Other Ambulatory Visit: Payer: Self-pay | Admitting: Family Medicine

## 2024-07-01 DIAGNOSIS — F4323 Adjustment disorder with mixed anxiety and depressed mood: Secondary | ICD-10-CM

## 2024-08-30 NOTE — Progress Notes (Unsigned)
 [facility] [address] [place], [place] [address] [phone]  Date:  09/02/2024   Name:  Cindy Kelly   DOB:  November 07, 1965   MRN:  [id number]  PCP:  Watt Harlene BROCKS, MD    Chief Complaint: No chief complaint on file.   History of Present Illness:  Cindy Kelly is a 58 y.o. very pleasant female patient who presents with the following:  Patient seen today for a virtual follow-up visit/controlled medication check.  I saw her most recently in May at which time we were dealing with insomnia.  Insomnia has been a long-term issue for her and she actually has a work accommodation which allows her to work from home if she is too drowsy to safely drive She takes trazodone  and alprazolam  at bedtime Patient location is home, location is office.  Patient identity confirmed with 2 factors, she gives consent for virtual visit today.  The patient and myself are present on the visit today PDMP reviewed 11/2-she last filled alprazolam  0.5 number 30 pills on 08/01/24  Discussed the use of AI scribe software for clinical note transcription with the patient, who gave verbal consent to proceed.  History of Present Illness     Patient Active Problem List   Diagnosis Date Noted   Acne 12/09/2015   Perioral dermatitis 01/19/2015   Vaginitis and vulvovaginitis 01/19/2015   Nausea alone 07/13/2014   Elevated IOP 05/18/2014   Situational insomnia 06/29/2011   Anxiety and depression 06/29/2011    Past Medical History:  Diagnosis Date   Depression    Insomnia    Kidney stones     Past Surgical History:  Procedure Laterality Date   AUGMENTATION MAMMAPLASTY     LITHOTRIPSY  2007-2015   x 3    Social History   Tobacco Use   Smoking status: Never   Smokeless tobacco: Never  Substance Use Topics   Alcohol use: No   Drug use: No    Family History  Problem Relation Age of Onset   Kidney disease  Father    Glaucoma Father    Colon cancer Neg Hx    Esophageal cancer Neg Hx    Rectal cancer Neg Hx    Stomach cancer Neg Hx     Allergies  Allergen Reactions   Aripiprazole     Headache, nausea, unable to sleep    Medication list has been reviewed and updated.  Current Outpatient Medications on File Prior to Visit  Medication Sig Dispense Refill   ALPRAZolam  (XANAX ) 0.5 MG tablet TAKE 1 TO 1 AND 1/2 TABLETS BY MOUTH AT BEDTIME AS NEEDED FOR SLEEP 30 tablet 1   FLUoxetine  (PROZAC ) 20 MG tablet Take 2 tablets (40 mg total) by mouth daily. 180 tablet 0   SIMBRINZA 1-0.2 % SUSP Place 1 drop into both eyes 3 (three) times daily.     timolol (BETIMOL) 0.25 % ophthalmic solution 1-2 drops 2 (two) times daily.     timolol (TIMOPTIC) 0.5 % ophthalmic solution Place 1 drop into the left eye 2 (two) times daily.     traZODone  (DESYREL ) 50 MG tablet TAKE 1/2 TO 1 TABLET(25 TO 50 MG) BY MOUTH AT BEDTIME AS NEEDED FOR SLEEP 90 tablet 3   No current facility-administered medications on file prior to visit.    Review of Systems:  As per HPI- otherwise negative.   Physical Examination: There were no vitals filed for this  visit. There were no vitals filed for this visit. There is no height or weight on file to calculate BMI. Ideal Body Weight:    Patient observed via video monitor  Assessment and Plan: No diagnosis found.  Assessment & Plan   Signed Harlene Schroeder, MD

## 2024-09-02 ENCOUNTER — Telehealth: Admitting: Family Medicine

## 2024-09-02 ENCOUNTER — Encounter: Payer: Self-pay | Admitting: Family Medicine

## 2024-09-02 DIAGNOSIS — F5102 Adjustment insomnia: Secondary | ICD-10-CM | POA: Diagnosis not present

## 2024-09-02 DIAGNOSIS — F4323 Adjustment disorder with mixed anxiety and depressed mood: Secondary | ICD-10-CM

## 2024-09-02 MED ORDER — ALPRAZOLAM 0.5 MG PO TABS: ORAL_TABLET | ORAL | 1 refills | Status: DC

## 2024-10-05 ENCOUNTER — Encounter: Payer: Self-pay | Admitting: Family Medicine

## 2024-10-06 MED ORDER — TRETINOIN 0.025 % EX CREA: TOPICAL_CREAM | Freq: Every day | CUTANEOUS | 1 refills | Status: DC

## 2024-10-07 ENCOUNTER — Telehealth: Payer: Self-pay

## 2024-10-07 MED ORDER — TRETINOIN 0.025 % EX CREA: TOPICAL_CREAM | Freq: Every day | CUTANEOUS | 1 refills | Status: AC

## 2024-10-07 MED ORDER — TRETINOIN 0.025 % EX GEL: Freq: Every day | CUTANEOUS | 0 refills | Status: DC

## 2024-10-07 NOTE — Telephone Encounter (Signed)
 Tretinoin  cream not covered by plan. Per pharmacy, they prefer gel. Please advise?

## 2024-10-07 NOTE — Addendum Note (Signed)
 Addended by: WATT RAISIN C on: 10/07/2024 12:28 PM   Modules accepted: Orders

## 2024-10-31 ENCOUNTER — Other Ambulatory Visit: Payer: Self-pay | Admitting: Family Medicine

## 2024-10-31 DIAGNOSIS — F4323 Adjustment disorder with mixed anxiety and depressed mood: Secondary | ICD-10-CM

## 2024-10-31 DIAGNOSIS — F5102 Adjustment insomnia: Secondary | ICD-10-CM

## 2024-11-15 ENCOUNTER — Other Ambulatory Visit: Payer: Self-pay | Admitting: Family Medicine

## 2024-11-15 DIAGNOSIS — G4700 Insomnia, unspecified: Secondary | ICD-10-CM

## 2024-11-24 ENCOUNTER — Encounter: Payer: Self-pay | Admitting: Family Medicine

## 2024-12-01 ENCOUNTER — Encounter: Payer: Self-pay | Admitting: Family Medicine

## 2024-12-02 ENCOUNTER — Ambulatory Visit: Admitting: Family Medicine

## 2024-12-02 NOTE — Progress Notes (Unsigned)
 Rescheduled

## 2024-12-02 NOTE — Patient Instructions (Signed)
 It was good to see you today, I will be in touch with your labs to soon as possible.  Assuming all is well please see me in about 6 months to follow-up on your alprazolam 

## 2024-12-02 NOTE — Progress Notes (Unsigned)
 No show

## 2024-12-02 NOTE — Patient Instructions (Signed)
 It was good to see you today, I will be in touch with your labs.  Assuming all is well please see me in about 6 months

## 2024-12-02 NOTE — Telephone Encounter (Signed)
 Form has been printed and placed in PCP's basket.

## 2024-12-03 ENCOUNTER — Ambulatory Visit: Admitting: Family Medicine

## 2024-12-03 DIAGNOSIS — Z130 Encounter for screening for diseases of the blood and blood-forming organs and certain disorders involving the immune mechanism: Secondary | ICD-10-CM

## 2024-12-03 DIAGNOSIS — Z0000 Encounter for general adult medical examination without abnormal findings: Secondary | ICD-10-CM

## 2024-12-03 DIAGNOSIS — Z1329 Encounter for screening for other suspected endocrine disorder: Secondary | ICD-10-CM

## 2024-12-03 DIAGNOSIS — Z124 Encounter for screening for malignant neoplasm of cervix: Secondary | ICD-10-CM

## 2024-12-03 DIAGNOSIS — Z13220 Encounter for screening for lipoid disorders: Secondary | ICD-10-CM

## 2024-12-03 DIAGNOSIS — Z131 Encounter for screening for diabetes mellitus: Secondary | ICD-10-CM

## 2024-12-09 ENCOUNTER — Encounter: Admitting: Family Medicine
